# Patient Record
Sex: Female | Born: 1940 | Race: White | Hispanic: No | Smoking: Current every day smoker
Health system: Southern US, Community
[De-identification: ages and names within clinical notes are randomized; demographics above are authoritative.]

## PROBLEM LIST (undated history)

## (undated) DIAGNOSIS — Z954 Presence of other heart-valve replacement: Secondary | ICD-10-CM

## (undated) DIAGNOSIS — E785 Hyperlipidemia, unspecified: Secondary | ICD-10-CM

## (undated) DIAGNOSIS — M545 Low back pain, unspecified: Secondary | ICD-10-CM

## (undated) DIAGNOSIS — M810 Age-related osteoporosis without current pathological fracture: Secondary | ICD-10-CM

## (undated) DIAGNOSIS — Z9889 Other specified postprocedural states: Secondary | ICD-10-CM

## (undated) DIAGNOSIS — R51 Headache: Secondary | ICD-10-CM

## (undated) DIAGNOSIS — R519 Headache, unspecified: Secondary | ICD-10-CM

## (undated) DIAGNOSIS — G8929 Other chronic pain: Secondary | ICD-10-CM

## (undated) DIAGNOSIS — I679 Cerebrovascular disease, unspecified: Secondary | ICD-10-CM

## (undated) DIAGNOSIS — I099 Rheumatic heart disease, unspecified: Secondary | ICD-10-CM

## (undated) DIAGNOSIS — I951 Orthostatic hypotension: Principal | ICD-10-CM

## (undated) DIAGNOSIS — I10 Essential (primary) hypertension: Secondary | ICD-10-CM

## (undated) DIAGNOSIS — K219 Gastro-esophageal reflux disease without esophagitis: Secondary | ICD-10-CM

## (undated) DIAGNOSIS — F329 Major depressive disorder, single episode, unspecified: Secondary | ICD-10-CM

## (undated) DIAGNOSIS — Z87898 Personal history of other specified conditions: Secondary | ICD-10-CM

## (undated) DIAGNOSIS — F3289 Other specified depressive episodes: Secondary | ICD-10-CM

## (undated) DIAGNOSIS — G2581 Restless legs syndrome: Secondary | ICD-10-CM

## (undated) HISTORY — PX: TUBAL LIGATION: SHX77

## (undated) HISTORY — DX: Personal history of other specified conditions: Z87.898

## (undated) HISTORY — DX: Presence of other heart-valve replacement: Z95.4

## (undated) HISTORY — DX: Rheumatic heart disease, unspecified: I09.9

## (undated) HISTORY — DX: Low back pain, unspecified: M54.50

## (undated) HISTORY — PX: CATARACT EXTRACTION: SUR2

## (undated) HISTORY — DX: Other specified depressive episodes: F32.89

## (undated) HISTORY — DX: Hyperlipidemia, unspecified: E78.5

## (undated) HISTORY — DX: Cerebrovascular disease, unspecified: I67.9

## (undated) HISTORY — PX: APPENDECTOMY: SHX54

## (undated) HISTORY — DX: Headache, unspecified: R51.9

## (undated) HISTORY — DX: Major depressive disorder, single episode, unspecified: F32.9

## (undated) HISTORY — DX: Essential (primary) hypertension: I10

## (undated) HISTORY — PX: LUMBAR DISC SURGERY: SHX700

## (undated) HISTORY — DX: Restless legs syndrome: G25.81

## (undated) HISTORY — PX: DILATION AND CURETTAGE OF UTERUS: SHX78

## (undated) HISTORY — PX: AORTIC VALVE REPLACEMENT: SHX41

## (undated) HISTORY — DX: Low back pain: M54.5

## (undated) HISTORY — DX: Other specified postprocedural states: Z98.890

## (undated) HISTORY — DX: Other chronic pain: G89.29

## (undated) HISTORY — DX: Gastro-esophageal reflux disease without esophagitis: K21.9

## (undated) HISTORY — PX: OTHER SURGICAL HISTORY: SHX169

## (undated) HISTORY — PX: ABDOMINAL HYSTERECTOMY: SHX81

## (undated) HISTORY — DX: Orthostatic hypotension: I95.1

## (undated) HISTORY — DX: Age-related osteoporosis without current pathological fracture: M81.0

## (undated) HISTORY — DX: Headache: R51

## (undated) HISTORY — PX: TONSILLECTOMY: SUR1361

## (undated) HISTORY — PX: CHOLECYSTECTOMY: SHX55

---

## 1998-06-28 ENCOUNTER — Other Ambulatory Visit: Admission: RE | Admit: 1998-06-28 | Discharge: 1998-06-28 | Payer: Self-pay | Admitting: Internal Medicine

## 1999-02-20 ENCOUNTER — Ambulatory Visit (HOSPITAL_COMMUNITY): Admission: AD | Admit: 1999-02-20 | Discharge: 1999-02-20 | Payer: Self-pay | Admitting: Cardiology

## 1999-07-17 ENCOUNTER — Ambulatory Visit (HOSPITAL_COMMUNITY): Admission: RE | Admit: 1999-07-17 | Discharge: 1999-07-17 | Payer: Self-pay | Admitting: Neurology

## 1999-07-21 ENCOUNTER — Ambulatory Visit (HOSPITAL_COMMUNITY): Admission: RE | Admit: 1999-07-21 | Discharge: 1999-07-21 | Payer: Self-pay | Admitting: Neurology

## 1999-07-21 ENCOUNTER — Encounter: Payer: Self-pay | Admitting: Neurology

## 1999-08-22 ENCOUNTER — Encounter: Admission: RE | Admit: 1999-08-22 | Discharge: 1999-09-02 | Payer: Self-pay | Admitting: Anesthesiology

## 1999-12-16 ENCOUNTER — Ambulatory Visit (HOSPITAL_COMMUNITY): Admission: RE | Admit: 1999-12-16 | Discharge: 1999-12-16 | Payer: Self-pay | Admitting: Internal Medicine

## 1999-12-16 ENCOUNTER — Encounter: Payer: Self-pay | Admitting: Internal Medicine

## 2000-01-21 ENCOUNTER — Inpatient Hospital Stay (HOSPITAL_COMMUNITY): Admission: AD | Admit: 2000-01-21 | Discharge: 2000-01-27 | Payer: Self-pay | Admitting: Neurology

## 2000-01-24 ENCOUNTER — Encounter: Payer: Self-pay | Admitting: Neurology

## 2001-08-21 ENCOUNTER — Encounter: Payer: Self-pay | Admitting: Family Medicine

## 2001-08-21 ENCOUNTER — Encounter (INDEPENDENT_AMBULATORY_CARE_PROVIDER_SITE_OTHER): Payer: Self-pay | Admitting: *Deleted

## 2001-08-21 ENCOUNTER — Inpatient Hospital Stay (HOSPITAL_COMMUNITY): Admission: EM | Admit: 2001-08-21 | Discharge: 2001-08-29 | Payer: Self-pay | Admitting: Emergency Medicine

## 2001-10-26 ENCOUNTER — Ambulatory Visit (HOSPITAL_COMMUNITY): Admission: RE | Admit: 2001-10-26 | Discharge: 2001-10-26 | Payer: Self-pay | Admitting: *Deleted

## 2001-10-26 ENCOUNTER — Encounter (INDEPENDENT_AMBULATORY_CARE_PROVIDER_SITE_OTHER): Payer: Self-pay | Admitting: Specialist

## 2002-03-08 ENCOUNTER — Emergency Department (HOSPITAL_COMMUNITY): Admission: EM | Admit: 2002-03-08 | Discharge: 2002-03-08 | Payer: Self-pay | Admitting: Emergency Medicine

## 2002-04-06 ENCOUNTER — Inpatient Hospital Stay (HOSPITAL_COMMUNITY): Admission: EM | Admit: 2002-04-06 | Discharge: 2002-04-08 | Payer: Self-pay | Admitting: Emergency Medicine

## 2002-04-07 ENCOUNTER — Encounter: Payer: Self-pay | Admitting: Emergency Medicine

## 2002-04-17 ENCOUNTER — Inpatient Hospital Stay (HOSPITAL_COMMUNITY): Admission: AD | Admit: 2002-04-17 | Discharge: 2002-04-23 | Payer: Self-pay | Admitting: Neurology

## 2002-04-18 ENCOUNTER — Encounter: Payer: Self-pay | Admitting: Neurology

## 2002-06-09 ENCOUNTER — Inpatient Hospital Stay (HOSPITAL_COMMUNITY): Admission: EM | Admit: 2002-06-09 | Discharge: 2002-06-17 | Payer: Self-pay | Admitting: Emergency Medicine

## 2002-06-09 ENCOUNTER — Encounter: Payer: Self-pay | Admitting: Emergency Medicine

## 2002-06-09 ENCOUNTER — Encounter: Payer: Self-pay | Admitting: Neurology

## 2002-06-12 ENCOUNTER — Encounter (INDEPENDENT_AMBULATORY_CARE_PROVIDER_SITE_OTHER): Payer: Self-pay | Admitting: Cardiology

## 2002-06-12 ENCOUNTER — Encounter: Payer: Self-pay | Admitting: Neurology

## 2002-06-14 ENCOUNTER — Encounter: Payer: Self-pay | Admitting: *Deleted

## 2004-02-19 ENCOUNTER — Encounter: Admission: RE | Admit: 2004-02-19 | Discharge: 2004-02-19 | Payer: Self-pay | Admitting: Neurology

## 2005-01-28 ENCOUNTER — Ambulatory Visit (HOSPITAL_COMMUNITY): Admission: RE | Admit: 2005-01-28 | Discharge: 2005-01-28 | Payer: Self-pay | Admitting: Cardiology

## 2005-01-28 ENCOUNTER — Encounter: Payer: Self-pay | Admitting: Cardiology

## 2005-02-04 ENCOUNTER — Encounter: Admission: RE | Admit: 2005-02-04 | Discharge: 2005-02-04 | Payer: Self-pay | Admitting: Neurology

## 2006-05-27 ENCOUNTER — Encounter: Admission: RE | Admit: 2006-05-27 | Discharge: 2006-08-25 | Payer: Self-pay | Admitting: Neurology

## 2008-07-25 ENCOUNTER — Emergency Department (HOSPITAL_COMMUNITY): Admission: EM | Admit: 2008-07-25 | Discharge: 2008-07-25 | Payer: Self-pay | Admitting: Emergency Medicine

## 2008-10-22 ENCOUNTER — Encounter: Payer: Self-pay | Admitting: Cardiology

## 2008-10-22 ENCOUNTER — Encounter (HOSPITAL_COMMUNITY): Admission: RE | Admit: 2008-10-22 | Discharge: 2009-01-20 | Payer: Self-pay | Admitting: Cardiology

## 2008-10-30 ENCOUNTER — Encounter: Payer: Self-pay | Admitting: Cardiology

## 2010-03-28 ENCOUNTER — Encounter: Payer: Self-pay | Admitting: Cardiology

## 2010-04-16 ENCOUNTER — Encounter: Payer: Self-pay | Admitting: Cardiology

## 2010-05-18 ENCOUNTER — Emergency Department (HOSPITAL_COMMUNITY): Admission: EM | Admit: 2010-05-18 | Discharge: 2010-05-18 | Payer: Self-pay | Admitting: Emergency Medicine

## 2010-06-19 DIAGNOSIS — I099 Rheumatic heart disease, unspecified: Secondary | ICD-10-CM | POA: Insufficient documentation

## 2010-06-19 DIAGNOSIS — F329 Major depressive disorder, single episode, unspecified: Secondary | ICD-10-CM

## 2010-06-19 DIAGNOSIS — I10 Essential (primary) hypertension: Secondary | ICD-10-CM | POA: Insufficient documentation

## 2010-06-20 ENCOUNTER — Ambulatory Visit: Payer: Self-pay | Admitting: Cardiology

## 2010-06-20 DIAGNOSIS — F17200 Nicotine dependence, unspecified, uncomplicated: Secondary | ICD-10-CM

## 2010-06-20 DIAGNOSIS — Z954 Presence of other heart-valve replacement: Secondary | ICD-10-CM

## 2010-06-20 DIAGNOSIS — Z9889 Other specified postprocedural states: Secondary | ICD-10-CM

## 2010-06-20 DIAGNOSIS — R072 Precordial pain: Secondary | ICD-10-CM | POA: Insufficient documentation

## 2010-07-08 ENCOUNTER — Telehealth (INDEPENDENT_AMBULATORY_CARE_PROVIDER_SITE_OTHER): Payer: Self-pay | Admitting: *Deleted

## 2010-07-08 ENCOUNTER — Encounter: Payer: Self-pay | Admitting: *Deleted

## 2010-07-09 ENCOUNTER — Encounter (HOSPITAL_COMMUNITY)
Admission: RE | Admit: 2010-07-09 | Discharge: 2010-09-13 | Payer: Self-pay | Source: Home / Self Care | Attending: Cardiology | Admitting: Cardiology

## 2010-07-09 ENCOUNTER — Encounter: Payer: Self-pay | Admitting: Cardiovascular Disease

## 2010-07-09 ENCOUNTER — Ambulatory Visit: Payer: Self-pay

## 2010-07-09 ENCOUNTER — Ambulatory Visit: Payer: Self-pay | Admitting: Cardiovascular Disease

## 2010-07-09 ENCOUNTER — Encounter: Payer: Self-pay | Admitting: Cardiology

## 2010-07-09 ENCOUNTER — Ambulatory Visit (HOSPITAL_COMMUNITY): Admission: RE | Admit: 2010-07-09 | Discharge: 2010-07-09 | Payer: Self-pay | Admitting: Cardiology

## 2010-07-14 ENCOUNTER — Telehealth: Payer: Self-pay | Admitting: Cardiology

## 2010-07-17 ENCOUNTER — Telehealth (INDEPENDENT_AMBULATORY_CARE_PROVIDER_SITE_OTHER): Payer: Self-pay | Admitting: *Deleted

## 2010-07-24 ENCOUNTER — Encounter: Payer: Self-pay | Admitting: Cardiology

## 2010-10-14 NOTE — Progress Notes (Signed)
  Walk in Patient Form Recieved " Pt needs surgical [place] paper left from [facility] sent to Message Nurse. [facility] [place]  July 17, 2010 1:37 PM

## 2010-10-14 NOTE — Progress Notes (Signed)
Summary: Nuclear pre procedure  Phone Note Outgoing Call Call back at [facility] Phone [phone]   Call placed by: Rea [employer], CMA,  July 08, 2010 3:42 PM Call placed to: Patient Summary of Call: Left message with information on Myoview Information Sheet (see scanned document for details).      Nuclear Med Background Indications for Stress Test: Evaluation for Ischemia, Abnormal EKG   History: Echo, Myocardial Perfusion Study  History Comments: S/P AVR/MVR; '10 [id number]  Symptoms: Chest Pain, Chest Pressure, DOE    Nuclear Pre-Procedure Cardiac Risk Factors: CVA, Family History - CAD, Hypertension, IDDM Type 2, Lipids Height (in): 70

## 2010-10-14 NOTE — Assessment & Plan Note (Signed)
Summary: cardiac eval/mt   History of Present Illness: 70 year old female for evaluation of aortic and mitral valvular disease. Patient status post aortic valve replacement and mitral valve replacement in 1990. Last echocardiogram in February of 2010 showed normal LV function, prosthetic aortic and mitral valves. Myoview in February of and showed an ejection fraction of 70% and no ischemia. Patient has mild dyspnea with exertion relieved with rest. There is no orthopnea, PND, pedal edema, palpitations or syncope. She has had intermittent chest pain since 1986 when she had strokes. It is in the left upper chest area. There is a sharp pain as well as a pressure. The pain can increase with certain movements. It is not exertional. She does take nitroglycerin for her pains.  Current Medications (verified): 1)  Coumadin 5 Mg Tabs (Warfarin Sodium) .... As Directed 2)  Nitrostat 0.4 Mg Subl (Nitroglycerin) .Marland Kitchen.. 1 Tablet Under Tongue At Onset of Chest Pain; You May Repeat Every 5 Minutes For Up To 3 Doses. 3)  Gabapentin 800 Mg Tabs (Gabapentin) .Marland Kitchen.. 1 Tab  Every 4 Hrs 4)  Amitriptyline Hcl 25 Mg Tabs (Amitriptyline Hcl) .... 3 Tabs By Mouth At Bedtime 5)  Metoprolol Succinate 50 Mg Xr24h-Tab (Metoprolol Succinate) .... 1/2 Tab By Mouth Two Times A Day  Past History:  Past Medical History: RHEUMATIC HEART DISEASE COUMADIN THERAPY DEPRESSION HYPERTENSION Mild hyperlipidemia GERD Remote H/O PUD History of mechanical valve, aortic and mitral, on chronic Coumadin therapy. 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  Past Surgical History: aortic valve replacement Mitral valve replacement 1990 hysterectomy Lumbar  Surgery   Arthroscopic  Knee  Surgery  History of cholecystectomy. History of D&C. Bilateral tubal ligation. Tonsillectomy. Appendectomy. Right knee surgery in 1993.  Family History: Reviewed history from 06/19/2010 and no  changes required.  Reveals that her mother died in her 81s from uterine cancer. Her father died at 40 from stomach cancer. She had one brother who  died at 57 from myocardial infarction. She had one brother who died at 55 from cancer. She has sisters 65 and 36 living and well. She has had four sisters who died, one from aneurysm and one from cancer, one from diabetes. She has  four children. One daughter died at age 81 from heart disease and had a heart murmur. One son died at age 55 from leukemia. She has a daughter 74, living and well, and a son 69 living and well.  Social History: Reviewed history from 06/19/2010 and no changes required.  She smokes 3-4 cigarettes a day. She does not drink  alcohol. Widowed   Review of Systems       Problems with dysarthria and right sided weakness from previous CVA but no fevers or chills, productive cough, hemoptysis, dysphasia, odynophagia, melena, hematochezia, dysuria, hematuria, rash, seizure activity, orthopnea, PND, pedal edema, claudication. Remaining systems are negative.   Vital Signs:  Patient profile:   70 year old female Weight:      67 pounds Pulse rate:   86 / minute BP sitting:   149 / 76  (left arm)  Vitals Entered By: Kem Parkinson (June 20, 2010 3:32 PM)  Physical Exam  General:  Well developed/well nourished in NAD Skin warm/dry Patient not depressed No peripheral clubbing Back-normal HEENT-normal/normal eyelids Neck supple/normal carotid upstroke bilaterally; no bruits; no JVD; no thyromegaly chest - CTA/ normal expansion CV - RRR/crisp mechanical valve sounds. 2/6 systolic ejection murmur. No diastolic murmur. Abdomen -NT/ND,  no HSM, no mass, + bowel sounds, no bruit 2+ femoral pulses, no bruits Ext-no edema, chords, 2+ DP Neuro-dysarthria. Mild weakness right upper and right lower extremity.     EKG  Procedure date:  06/20/2010  Findings:      Unusual P-wave axis. Probable ectopic atrial rhythm.  Incomplete right bundle branch block. No ST changes.  Impression & Recommendations:  Problem # 1:  CHEST PAIN, PRECORDIAL (ICD-786.51) Symptoms somewhat atypical and chronic. Schedule Myoview. Her updated medication list for this problem includes:    Coumadin 5 Mg Tabs (Warfarin sodium) .Marland Kitchen... As directed    Nitrostat 0.4 Mg Subl (Nitroglycerin) .Marland Kitchen... 1 tablet under tongue at onset of chest pain; you may repeat every 5 minutes for up to 3 doses.    Metoprolol Succinate 50 Mg Xr24h-tab (Metoprolol succinate) .Marland Kitchen... 1/2 tab by mouth two times a day    Aspirin 81 Mg Tbec (Aspirin) .Marland Kitchen... Take one tablet by mouth daily  Orders: Nuclear Stress Test (Nuc Stress Test)  Problem # 2:  COUMADIN THERAPY (ICD-V58.61) Monitored by primary care. Goal INR 2.5-3.5. Orders: Echocardiogram (Echo)  Problem # 3:  MITRAL VALVE REPLACEMENT, HX OF (ICD-V15.1) Continued SBE prophylaxis. Scheduled echocardiogram. Add enteric-coated aspirin 81 mg p.o. daily.  Problem # 4:  AORTIC VALVE REPLACEMENT, HX OF (ICD-V43.3) As per #3.  Problem # 5:  TOBACCO ABUSE (ICD-305.1) Patient counseled on discontinuing.  Problem # 6:  HYPERTENSION (ICD-401.9) Blood pressure mildly elevated but she states is normal at home. We will follow this and add additional medications as needed. Her updated medication list for this problem includes:    Metoprolol Succinate 50 Mg Xr24h-tab (Metoprolol succinate) .Marland Kitchen... 1/2 tab by mouth two times a day    Aspirin 81 Mg Tbec (Aspirin) .Marland Kitchen... Take one tablet by mouth daily  Patient Instructions: 1)  Your physician recommends that you schedule a follow-up appointment in: ONE YEAR 2)  Your physician has recommended you make the following change in your medication: START ASPIRIN 81MG  ONCE DAILY 3)  Your physician has requested that you have an echocardiogram.  Echocardiography is a painless test that uses sound waves to create images of your heart. It provides your doctor with information about  the size and shape of your heart and how well your heart's chambers and valves are working.  This procedure takes approximately one hour. There are no restrictions for this procedure. 4)  Your physician has requested that you have an exercise stress myoview.  For further information please visit [email or web address]  Please follow instruction sheet, as given.

## 2010-10-14 NOTE — Progress Notes (Signed)
Summary: discuss anibiotic for eye surgery - discuss coumadin  Phone Note Call from Patient Call back at Home Phone [phone]   Caller: Vanessa Bartlett [phone] Reason for Call: Talk to Nurse Summary of Call: per pt son called, does pt need any antibiotic for eye surgery , does she need to come off coumadin.  Initial call taken by: [name],  July 14, 2010 3:56 PM  Follow-up for Phone Call        spoke with pt son, pt will require antibiotics prior tp procedure. they have a pre-op appt this week and will find out if coumadin is an issue and call back [name], RN  July 14, 2010 5:18 PM     New/Updated Medications: AMOXICILLIN 500 MG CAPS (AMOXICILLIN) take one hour prior to procedure Prescriptions: AMOXICILLIN 500 MG CAPS (AMOXICILLIN) take one hour prior to procedure  #4 x 6   Entered by:   [name], RN   Authorized by:   [name], MD, [name]   Signed by:   [name], RN on 07/14/2010   Method used:   Electronically to        [facility]  [phone]* (retail)       [address] [place], [place]  [address]       Ph: [phone] or [phone]       Fax: [phone]   RxID:   [id number]

## 2010-10-14 NOTE — Assessment & Plan Note (Signed)
Summary: Cardiology Nuclear Testing  Nuclear Med Background Indications for Stress Test: Evaluation for Ischemia, Abnormal EKG   History: Echo, Myocardial Perfusion Study  History Comments: '90 AVR/MVR; '10 QIO:NGEXBM  Symptoms: Chest Pain, Chest Pressure, Diaphoresis, Dizziness, DOE, Fatigue, Light-Headedness, Rapid HR  Symptoms Comments: Last episode of WU:XLKG week   Nuclear Pre-Procedure Cardiac Risk Factors: CVA, Family History - CAD, Hypertension, Lipids, Smoker Caffeine/Decaff Intake: None NPO After: 9:00 PM Lungs: Clear.  O2 Sat 98% on RA. IV 0.9% NS with Angio Cath: 22g     IV Site: R Antecubital IV Started by: Bonnita Levan, RN Chest Size (in) 34     Cup Size B     Height (in): 70 Weight (lb): 137 BMI: 19.73  Nuclear Med Study 1 or 2 day study:  1 day     Stress Test Type:  Eugenie Birks Reading MD:  Charlton Haws, MD     Referring MD:  Olga Millers, MD Resting Radionuclide:  Technetium 67m Tetrofosmin     Resting Radionuclide Dose:  11 mCi  Stress Radionuclide:  Technetium 25m Tetrofosmin     Stress Radionuclide Dose:  33 mCi   Stress Protocol   Lexiscan: 0.4 mg   Stress Test Technologist:  Rea [employer], CMA-N     Nuclear Technologist:  Doyne Keel, CNMT  Rest Procedure  Myocardial perfusion imaging was performed at rest 45 minutes following the intravenous administration of Technetium 63m Tetrofosmin.  Stress Procedure  The patient received IV Lexiscan 0.4 mg over 15-seconds.  Technetium 50m Tetrofosmin injected at 30-seconds.  There were no significant changes with infusion, other than occasional PVC's.  Quantitative spect images were obtained after a 45 minute delay.  QPS Raw Data Images:  Normal; no motion artifact; normal heart/lung ratio. Stress Images:  Normal homogeneous uptake in all areas of the myocardium. Rest Images:  Normal homogeneous uptake in all areas of the myocardium. Subtraction (SDS):  Normal Transient Ischemic Dilatation:  .94  (Normal  <1.22)  Lung/Heart Ratio:  .29  (Normal <0.45)  Quantitative Gated Spect Images QGS EDV:  46 ml QGS ESV:  14 ml QGS EF:  69 % QGS cine images:  normal  Findings Normal nuclear study      Overall Impression  Exercise Capacity: Lexiscan with no exercise. BP Response: Normal blood pressure response. Clinical Symptoms: Nausea, Dyspnea and Headache ECG Impression: No significant ST segment change suggestive of ischemia. Overall Impression: Normal stress nuclear study.  Appended Document: Cardiology Nuclear Testing ok  Appended Document: Cardiology Nuclear Testing pt aware of results

## 2010-10-14 NOTE — Letter (Signed)
Summary: [facility] Surgical Clearance   [facility] Surgical Clearance   Imported By: Roderic Ovens 07/31/2010 17:29:25  _____________________________________________________________________  External Attachment:    Type:   Image     Comment:   External Document

## 2010-11-27 LAB — DIFFERENTIAL
Basophils Absolute: 0 10*3/uL (ref 0.0–0.1)
Lymphocytes Relative: 20 % (ref 12–46)
Lymphs Abs: 0.8 10*3/uL (ref 0.7–4.0)
Neutro Abs: 2.8 10*3/uL (ref 1.7–7.7)

## 2010-11-27 LAB — BASIC METABOLIC PANEL
BUN: 13 mg/dL (ref 6–23)
CO2: 24 mEq/L (ref 19–32)
Chloride: 108 mEq/L (ref 96–112)
GFR calc Af Amer: 33 mL/min — ABNORMAL LOW (ref >60)
GFR calc non Af Amer: 28 mL/min — ABNORMAL LOW (ref >60)
Sodium: 137 mEq/L (ref 135–145)

## 2010-11-27 LAB — CBC
HCT: 37.6 % (ref 36.0–46.0)
MCHC: 34.4 g/dL (ref 30.0–36.0)
Platelets: 134 10*3/uL — ABNORMAL LOW (ref 150–400)
RDW: 14.6 % (ref 11.5–15.5)

## 2010-12-30 ENCOUNTER — Other Ambulatory Visit: Payer: Self-pay | Admitting: Neurology

## 2010-12-30 DIAGNOSIS — R42 Dizziness and giddiness: Secondary | ICD-10-CM

## 2010-12-30 DIAGNOSIS — I679 Cerebrovascular disease, unspecified: Secondary | ICD-10-CM

## 2010-12-30 DIAGNOSIS — R4701 Aphasia: Secondary | ICD-10-CM

## 2010-12-30 DIAGNOSIS — R209 Unspecified disturbances of skin sensation: Secondary | ICD-10-CM

## 2010-12-30 DIAGNOSIS — R51 Headache: Secondary | ICD-10-CM

## 2011-01-08 ENCOUNTER — Ambulatory Visit
Admission: RE | Admit: 2011-01-08 | Discharge: 2011-01-08 | Disposition: A | Discharge status: Home / Self Care | Payer: Medicare Other | Source: Ambulatory Visit | Attending: Neurology | Admitting: Neurology

## 2011-01-08 DIAGNOSIS — I679 Cerebrovascular disease, unspecified: Secondary | ICD-10-CM

## 2011-01-08 DIAGNOSIS — R42 Dizziness and giddiness: Secondary | ICD-10-CM

## 2011-01-08 DIAGNOSIS — R51 Headache: Secondary | ICD-10-CM

## 2011-01-08 DIAGNOSIS — R4701 Aphasia: Secondary | ICD-10-CM

## 2011-01-08 DIAGNOSIS — R209 Unspecified disturbances of skin sensation: Secondary | ICD-10-CM

## 2011-01-30 NOTE — H&P (Signed)
[place]  Patient:    Vanessa Bartlett, Vanessa Bartlett Visit Number: [id number] MRN: [id number]          Service Type: MED Location: [id number] Attending Physician:  Thedore Mins Dictated by:   Trudee Kuster, M.D. Admit Date:  08/21/2001   CC:         Soyla Murphy. Renne Crigler, M.D.   History and Physical  ADMITTING DIAGNOSES: 1. Vaginal bleed. 2. Coagulopathy of idiopathic origin. 3. Anorexia and diarrhea.  HISTORY OF PRESENT ILLNESS:  Vanessa Bartlett presented to the emergency department after complaining of being sick for the past four weeks.  She states that over the last three weeks she has not been able to eat.  She has been having diarrhea over that time.  Shortly after eating she has to evacuate and feels like that food is going straight through her.  The patient was unable to see her primary physician secondary to inclement weather.  Over the last three to four days, the patient has developed severe vaginal bleeding.  She is on Coumadin secondary to a history of valve replacement and CVAs.  The patient denies any other bleeds.  The patient denies any melena or hematochezia.  The patient denies fevers or chills.  The patient has had some nausea but no vomiting.  The patient has had some intermittent chest pain which is chronic as she is on nitroglycerin.  She has a history of coronary artery disease it seems.  PAST MEDICAL HISTORY:  The past medical history is significant for CVA x 2, cholecystectomy, multiple back surgeries, and hysterectomy secondary to dysfunctional uterine bleeding and atrial fibrillation.  MEDICATIONS: 1. Coumadin 5 mg p.o. q.d. 2. Zyprexa 5 mg q.h.s. 3. Zoloft 50 mg q.d. 4. Temazepam 15 mg p.o. q.h.s. 5. Lopressor 25 mg b.i.d. 6. Xanax 0.5 mg t.i.d. p.r.n. 7. Nitroglycerin patch. 8. Prevacid 30 mg p.o. q.d. 9. Neurontin 600 mg p.o. q.i.d.  FAMILY HISTORY:  No history of gynecologic cancers. Mother died of  esophageal cancer.  ALLERGIES:  No known drug allergies.  SOCIAL HISTORY:  The patient lives alone in [place], [place].  He does smoke.  REVIEW OF SYSTEMS:  As above.  The patient denies any chest pain currently . The patient has no dyspnea or shortness of breath.  The patient denies any abdominal pain, urinary systems.  PHYSICAL EXAMINATION:  VITAL SIGNS:  Temperature 97.4, pulse 74, blood pressure 122/90, O2 saturations 95% on room air.  GENERAL:  Elderly woman in no obvious distress.  HEENT:  Normocephalic.  Pupils are equal, round and reactive to light.  No papilledema.  TMs clear.  Oropharynx pink, moist mucosa.  NECK:  Supple, no bruits, no adenopathy.  HEART:  Irregular rhythm, mechanical click, 2/6 systolic murmur.  LUNGS:  Clear bilaterally.  ABDOMEN:  Soft. Positive bowel sounds.  EXTREMITIES:  No cyanosis, clubbing or edema.  NEUROLOGICAL:  The patient is alert and oriented.  Cranial nerves II-XII grossly intact.  Strength is 5/5 in all extremities.  LABORATORY DATA:  Hemoglobin 16.0, white cell count 5.7, platelet 207,000. INR 22.2.  Urinalysis negative.  ASSESSMENT AND PLAN: 1. Vaginal bleeding.  The patient comes in with primary complaint of    vaginal bleeding.  The patient has an uncertain etiology as she has    had a hysterectomy.  Given the fact that she has had weight loss,    and bleeding, I would be concerned for some time of gynecologic  malignancy.  The patient probably wants evaluation by gynecology. 2. Coagulopathy.  The patient is on Coumadin, and typically has this    checked in the Coumadin Clinic at [facility].  However,    the patients INR is still elevated today.  Because she is having    some evidence of vaginal bleeding we will give 5 mg of vitamin K    intravenously.  The patient is hemodynamically stable. Hold all    Coumadin at this point in time and proceed with additional monitoring. 3. Anorexia.  The patient has  not been feeling well for the last several    weeks.  Based on the laboratory data that I have at this time, there is    no clear cut etiology.  My concern would be an occult malignancy in    this patient.  She states that she has lost 12-16 pounds over the    last several weeks but she has not been eating anything according to    her son.  This is probably a combination of dehydration, poor intake. 4. Diarrhea.  The patient reports frequent loose stools shortly after    eating.  We will continue to follow this, and the patient will need    work-up.  I do not see any evidence of an infectious cause at this time.    The patient is afebrile and does not have a white count.  Her    abdominal exam was benign. Dictated by:   Trudee Kuster, M.D. Attending Physician:  Thedore Mins DD:  08/21/01 TD:  08/21/01 Job: [id number]

## 2011-01-30 NOTE — H&P (Signed)
NAME:  Vanessa Bartlett, Vanessa Bartlett                            ACCOUNT NO.:  [id number]   MEDICAL RECORD NO.:  [id number]                   PATIENT TYPE:  INP   LOCATION:  3004                                 FACILITY:  [facility]   PHYSICIAN:  Genene Churn. Love, MD                   DATE OF BIRTH:  [birth year]   DATE OF ADMISSION:  06/09/2002  DATE OF DISCHARGE:                                HISTORY & PHYSICAL   CHIEF COMPLAINT:  This is  one of several [facility] admissions for  this 70 year old  right-handed white widowed female who lives alone. She is  brought to the emergency room and admitted after a fall with complaints of  right-sided weakness.   HISTORY OF PRESENT ILLNESS:  The patient has a history of left brain stroke  with aphasia in the1980s, possibly 1985, and has had a repeat stroke in  November of 2001. At that time she required a PEG tube for difficulty with  dysphagia. She has had residual right-sided weakness and stuttering speech  but lives alone and is independent in activities of daily living. On the  night of admission about 6:30 p.m., she was going up steps and fell when her  right leg gave way. She went down the entire set of  steps,  striking her  occiput and her right knee. She came to the emergency room with complaints  of new onset right-sided weakness.   PAST MEDICAL HISTORY:  Significant for:  1. 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  2. Status post aortic valve and mitral valve replacement with mechanical     valves for rheumatic heart disease.  3. She has a history of rheumatic heart disease.  4. Congenital atrophy of one kidney.  5. Status post stroke with aphasia and right hemiparesis.  6. Hypertension.  7. Chronic cigarette use.   PAST SURGICAL HISTORY:  She has had operations including:  1. Cholecystectomy in 1993.  2. Hysterectomy.  3. Dilatation and curettages x 4.  4. Bilateral tubal ligations.  5.  Tonsillectomy.  6. Appendectomy.  7. Right knee surgery in  1993.   MEDICATIONS:  1. Neurontin 600 mg q.i.d.  2. Darvocet-N 100 1 q.i.d.  3. Amitriptyline 25, 2 q.h.s.  4. Metoprolol 50 mg 1/2 b.i.d.  5. Zoloft 100 mg q.d.  6. Warfarin 5 mg q.d.  7. OxyContin 10 mg t.i.d.  8. Prevacid 30 mg q.d.  9. Nitroglycerin.   ALLERGIES:  No known history of allergies.   SOCIAL HISTORY:  She smokes three to four cigarettes per day. She does not  drink alcohol.  Education, she went through high school. She is currently on  disability.   FAMILY HISTORY:  Reveals that her mother died in her 64s from uterine  cancer. Her father died at 104 from stomach  cancer. She had one brother who  died at 43 from myocardial infarction. She had one brother who died at 28  from cancer. She has sisters 39 and 19 living and well. She has had four  sisters who died, one from MI and one from cancer, one from diabetes. She  has  four children. One daughter died at age 45 from heart disease and had a  heart murmur. One son died at age 9 from leukemia. She has a daughter 33,  living and well, and a son 51 living and well.   PHYSICAL EXAMINATION:  GENERAL:  A well developed white female lying in bed  with an abrasion to her occiput and a right knee abrasion. She was in a neck  collar.  VITAL SIGNS:  Blood pressure right arm 140/80, left arm 130/80, heart rate  77 and irregular. There were no carotid or supraclavicular bruits heard.  HEENT:  Revealed the tympanic membranes to be clear. She had abrasions as  mentioned.  LUNGS:  Clear to auscultation.  HEART:  No murmurs. She did have mechanical heart sounds.  ABDOMEN:  There was no enlargement of  liver spleen or kidneys.  Bowel  sounds were normal.  EXTREMITIES:  No cyanosis, clubbing or edema.  NEUROLOGIC:  Mental status, she is alert and oriented x 3. She knew the  present governor but not the vice president. She could follow one, two and  three step commands.  She spoke with stuttering speech. She could name  objects and could repeat phrases. Her cranial nerve examination revealed the  visual fields  to be full without any definite visual field loss. She had a  mild right seventh, she had stuttering speech. Her tongue was benign. Gags  were present. Motor examination revealed unusual ataxia in the upper arm and  in the right lower leg, which was hard to evaluate. There was also giving  away phenomena in the right leg. Her sensory examination revealed subjective  altered sensation to pinprick in the right face, arm and leg. When one point  was given she would feel none; when two points were given, she would feel  one in her right hand. The deep tendon reflexes were 1 to 2+. Plantar  responses were  downgoing.   LABORATORY DATA:  An EKG showed incomplete right bundle branch block. A CT  scan showed an old left middle cerebral artery and old right middle cerebral  artery strokes. A CT scan of the  cervical spine was negative. There was  some evidence of spondylosis of the C5-6.   White blood cell count 9100, hemoglobin 14.3, hematocrit 42.3, platelets 155  K.  Protime 18.2 with an INR of 1.6. Sodium 135, potassium 4.0, chloride  104, CO2 content 24, BUN 27, creatinine 1.8, glucose 104, calcium 9.2.   IMPRESSION:  1. Fall with head trauma, code 850.0.  2. Right-sided weakness, code 342.10.  3. Rule out stroke, code 434.01.  4. Aphasia,  code 784.3.  5. Stroke, left brain and right brain in the past, code 434.01.  6 .  History of rheumatic fever.  1. Aortic valve, 424.1, replacement and mitral valve 424.0 replacement.  2. Lumbar radiculopathy, right S1, code 353.4.  3. Low back pain, code 724.4.  4. Hypertension code 796.2.    PLAN:  The plan at this time is to admit the patient and place her on  heparin therapy, and then switch to  Coumadin.  Genene Churn. Love, MD   JML/MEDQ  D:  06/09/2002   T:  06/12/2002  Job:  [id number]   cc:   Soyla Murphy. Renne Crigler, M.D.  [address]  [place]  [place] [address]  Fax: [phone]

## 2011-01-30 NOTE — Discharge Summary (Signed)
[place]. [facility]  Patient:    Vanessa Bartlett, Vanessa Bartlett                         MRN: [id number] Adm. Date:  [id number] Disc. Date: 01/27/00 Attending:  Lesly Dukes CC:         [facility]                           Discharge Summary  ADMISSION DIAGNOSES:  1. Neck pain, probable left cervical radiculopathy.  2. 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  3. 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  4. History of hypertension.  DISCHARGE DIAGNOSES:  1. History of left neck and left arm pain, etiology unclear.  2. History of hypertension.  3. History of prior left brain stroke with residual aphasia.  PROCEDURES:  1. Cervical myelogram.  2. CT scan of the cervical spine.  COMPLICATIONS: None.  HISTORY OF PRESENT ILLNESS: Vanessa Bartlett is a 70 year old right-handed white female born 22-Jul-1941, with a history of chronic low back pain and right leg pain.  The patient has had problems with neck pain and left arm pain that had begun ten days prior to hospitalization.  The patient claims the left arm feels weaker than usual.  The patient was given a trial on Neurontin and is on Vioxx with no significant improvement with the pain.  The patient is brought into the hospital for cervical myelogram.  She was taken off Coumadin prior to this.  PAST MEDICAL HISTORY:  1. History of lumbosacral spine surgery x 3 in the past, last in 1987, with     chronic right S1 radiculopathy.  2. Aortic valve and mitral valve replacement with mechanical valve.  3. History of rheumatic heart disease.  4. History of gallbladder resection.  5. History of hysterectomy in 1977.  6. History of D&C x 4 in the past.  7. History of bilateral tubal ligation.  8. History of tonsillectomy.  9. History of appendectomy. 10. History of right knee surgery. 11. History of congenital  atrophy of one kidney. 12. 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 13. History of hypertension.  MEDICATIONS PRIOR TO ADMISSION:  1. Neurontin 600 mg t.i.d.  2. Amitriptyline 100 mg q.h.s.  3. Lanoxin 0.125 mg q.d.  4. Norvasc 5 mg q.d.  5. Premarin 0.625 mg q.d.  6. Remeron 15 mg q.d.  ALLERGIES: No known drug allergies.  SOCIAL HISTORY: The patient smokes one-third a pack of cigarettes a day and does not drink alcohol.  FAMILY HISTORY, SOCIAL HISTORY, REVIEW OF SYSTEMS, PHYSICAL EXAMINATION: Please refer to the History and Physical dictation for details.  LABORATORY DATA: CBC showed a WBC of 8.1, hemoglobin 12.9, hematocrit 39.1, MCV 94.3, platelets 214,000.  Sedimentation rate 28.  Sodium 141, potassium 4.2, chloride 103, CO2 31, glucose 99, BUN 14, creatinine 1.2.  Calcium 9.4, total protein 7.3, albumin 3.7, AST 25, ALT 13, alkaline phosphatase 137, total bilirubin 0.7.  Digoxin level 0.9.  EKG revealed heart rate of 86, possible ectopic atrial rhythm; ST-T wave abnormalities.  HOSPITAL COURSE: The patient has done fairly well during the course of hospitalization.  The patient was admitted to be placed on heparin prior to cervical myelogram.  The patient was taken off Coumadin  two days prior to admission.  INR on admission was 1.7.  The patient was set up for CT following cervical myelogram after amitriptyline was discontinued.  The patient continued to have pain in the left neck and arm and some weakness in intrinsic muscles of the left hand.  The patient was set up for the study, which was done on Jan 24, 2000.  The study revealed evidence of facet disease at C3-4, C4-5, and bilaterally at C5-6 levels.  There was a nerve root cut-off at C6 level on the right, possibly a little bit on the left, but the neural foramen appeared to be fairly open on CT scan on the left.  The patient was placed back on Coumadin following the procedure and was  discharged on her usual Coumadin dosing, which was 5 mg q.d. with the exception of 1/2 tablet on Tuesdays and Thursdays.  DISCHARGE MEDICATIONS:  1. Sublingual nitroglycerin 0.4 mg p.r.n.  2. Lanoxin 0.125 mg q.d.  3. Coumadin as above.  4. Norvasc 5 mg q.d.  5. Prevacid 30 mg q.d.  6. Lipitor 10 mg q.d.  7. Premarin 0.625 mg q.d.  8. Amitriptyline 100 mg q.h.s.  9. Neurontin 600 mg t.i.d. 10. Soma 1 t.i.d. if needed.  FOLLOW-UP: We will follow up with this patient in about three weeks following discharge, and may pursue an EMG nerve conduction study if pain continues.  DISCHARGE CONDITION: At the time of discharge the patient was bright, alert, cooperative, fully ambulatory, and had residual aphasia.  DD:  01/27/00 TD:  01/27/00 Job: [id number]

## 2011-01-30 NOTE — Discharge Summary (Signed)
NAME:  Bartlett, Vanessa                            ACCOUNT NO.:  [id number]   MEDICAL RECORD NO.:  [id number]                   PATIENT TYPE:  INP   LOCATION:  3741                                 FACILITY:  [facility]   PHYSICIAN:  Soyla Murphy. Renne Crigler, M.D.               DATE OF BIRTH:  06/27/1941   DATE OF ADMISSION:  04/06/2002  DATE OF DISCHARGE:  04/08/2002                                 DISCHARGE SUMMARY   ELECTROCARDIOGRAM:  Normal sinus rhythm, within normal limits.   LABORATORY REPORTS:  Initial pH 7.53, PCO2 29, PO2 91.  Amylase 63.  CK and  CK-MB were normal.  Initial sodium 138, potassium 3.4.  The remainder of the  CMET was normal.  Initial INR was high at 5.2.  Lipase was 46.  Troponin-I  was less than 0.01.  BNP 51.9 - normal.  Partial thromboplastin time 50.  CBC normal.  Differential normal.  On the 25th, INR was 4.7 and CK-MB's were  negative.  Repeat potassium was within normal range.   CT scan of the chest showed no masses or effusions; suggestion of fatty  liver.   HOSPITAL COURSE:  Please see admission history and physical for details of  her presentation.  Briefly, she was admitted with new, sharp intermittent  left chest pain.  The pain was moderate to severe and in the left mid  sternocostal area.  It did not seem to have any clear exacerbated or  remitting factors.  She ruled out for myocardial infarction on telemetry and  was seen in consultation by Dr. Aleen Campi.  He felt that the pain was  probably noncardiac and might be related to abruptly stopping the Neurontin.  He recommended that she have, as an outpatient, a Persantine Cardiolite  study, as well as a duplex ultrasound of the renal arteries.   Also, during the hospital stay, she was seen by Dr. Virginia Rochester for her month-long  diarrhea, associated with weight loss.  He ordered additional stools.  Surprisingly, she has had no bowel movements so far during the hospital  stay, but will try to leave [place] a sample so that  we can send it for multiple  analyses before she leaves today.   Also during the stay it was noted that her INR was excessively elevated.  For that reason, she will go home on a lower dose of Coumadin and have the  prothrombin time rechecked in the office on Monday.   She also had a low potassium level, which was corrected.   Another possibility as to the source of her pain could be esophageal, as she  had been out of her proton pump inhibitor for about a month.  She was given  intravenous Protonix.  The pain was 75-80% improved by the time of  discharge.   DISPOSITION:  Vanessa Bartlett was discharged to home on the following medications:  1. Darvocet-N 100  one by mouth four times a day, as needed for extra pain     relief.  2. Restoril 15 mg one at bedtime as needed for sleep until she gets more     Zyprexa on Monday.  3. Zyprexa 5 mg p.o. one-half hour before bedtime.  4. Zoloft 50 mg each morning.  5. Lopressor 50 mg (metoprolol) one-half tablet p.o. twice a day.  6. Nitroglycerin patch 0.4 mg/hour.  7. Protonix 40 mg p.o. daily.  8. Coumadin 5 mg tablets one-half tablet Saturday night, one tablet Sunday     night.  Recheck prothrombin time on Monday.  9. She may restart the Neurontin, if desired.  10.      Sublingual nitroglycerin 0.4 mg p.r.n.  11.      Alprazolam 0.5 mg three times a day as needed.   It is also noted that she was initially hyperventilating.  Anxiety may have  played some part in the initial presentation.  Her overall condition was  improved.   IMPRESSION:  1. Chest pain, cause unclear.  2. Gastroesophageal reflux.  3. Hypokalemia, corrected.  4. Initial hyperventilation.  5. Chronic anxiety and depression.  6. History of cerebrovascular accident.  7. Status post aortic and mitral valve replacement - on chronic Coumadin     therapy.  8. Diarrhea.  9. Weight loss.  10.      History of multiple surgeries.     a. Cholecystectomy.     b. Lumbar disc surgery.      c. Appendectomy.                                               Soyla Murphy. Renne Crigler, M.D.    WDP/MEDQ  D:  04/08/2002  T:  04/16/2002  Job:  [id number]   cc:   Soyla Murphy. Renne Crigler, M.D.  Fax: [phone]   Aram Candela. Aleen Campi, M.D.  Fax: [phone]

## 2011-01-30 NOTE — Consult Note (Signed)
[place]. [facility]  Patient:    Vanessa Bartlett, Vanessa Bartlett Visit Number: [id number] MRN: [id number]          Service Type: MED Location: 5500 [id number] [id number] Attending Physician:  Thedore Mins Dictated by:   Sabino Gasser, M.D. Admit Date:  08/21/2001                            Consultation Report  Ms. Suthers is a very nice 70 year old lady who lives in [place], [place] who I have been asked to see because of diarrhea, anorexia, weight loss.  Patient states that the diarrhea started approximately three weeks ago and typically she notices it at any time of the day but usually it is preceded by eating and within a half an hour or so she will have diarrhea one to a couple of times. There have been episodes of vomiting occasionally with this.  She had some vomiting on Sunday at which point she had vomited up about a half an hour after eating some yellowish material.  She states that she has never had problems with diarrhea like this before and during this period of time she has lost 13 pounds of weight.  She denies any blood in her stool, any abdominal pain.  She has had abdominal cramping.  There has been a 13 pound weight loss that she noted over this period of time.  She denies dysphagia.  She does have anorexia.  No desire to eat.  Part of this is because she notes that it causes her diarrhea.  Typically she eats, gets abdominal cramping, and then has diarrhea which relieves the cramping.  She never has nocturnal diarrhea.  She has stated that her stools have been black.  She thinks that at some time she may have had a stomach ulcer and apparently about four years ago had endoscopy and colonoscopy and she tells me that her recall is that those studies were negative.  She did have some trouble eating after she had valvular surgery done last year at [facility] and at that time she did require a PEG placement for feeding.  She does not remember any recent  antibiotic use. She does not have anybody else that she is close to that has diarrhea.  She has not been out of the country nor does she remember eating any tainted foods.  She initially did not recall any new medications but upon further speaking to her she has been on Zoloft which started a couple of months ago, she believes.  PAST MEDICAL HISTORY:  Notable for the fact that she has had two strokes.  One was about 17 years ago and the other one was after surgery last year.  She had an aortic valve replaced, again, at [facility] and has been on Coumadin.  Additionally, as stated, she had been on Zyprexa at bedtime, Zoloft, temazepam, Lopressor for hypertension, Xanax, nitroglycerin patch, Prevacid because of the hiatal hernia, and Neurontin.  She has no history of gynecologic disorders.  FAMILY HISTORY:  Esophageal cancer.  SOCIAL HISTORY:  She lives alone but her brother who was visiting has recently been diagnosed with cancer.  Her best friend has been told of having about six months to live.  She has a son who was diagnosed with leukemia.  All this has occurred in the last three months.  She has four children total, the first of which died at age  2 weeks.  She states that over the past few weeks she has not been feeling well.  She was hospitalized because of vaginal bleeding which apparently may have been partially induced by an increased INR because of the Coumadin.  She does smoke.  Does not drink.  PAST SURGICAL HISTORY:  In the past she has had a cholecystectomy, multiple back surgeries, the CABG and valvular replacement, hysterectomy.  History of atrial fibrillation.  ALLERGIES:  No known drug allergies.  REVIEW OF SYSTEMS:  At this time denies chest pain, shortness of breath.  Has some mild degree of dysarthria since she has had strokes and weakness of the left arm.  PHYSICAL EXAMINATION  GENERAL:  Alert, oriented, as mentioned, slightly  dysarthric.  VITAL SIGNS:  Temperature 96.9, blood pressure 141/73, pulse 66, respiratory rate 18 and unlabored, 94% saturation on room air.  HEENT:  Without jaundice.  She is in no distress.  Awake, alert, oriented.  NECK:  No bruits have been noted in the neck.  HEART:  Regular rhythm with a mechanical click, 2/6 systolic murmur.  LUNGS:  Clear.  ABDOMEN:  Positive bowel sounds.  Abdomen is soft, nontender.  RECTAL:  Stool is brown.  IMPRESSION:  Patient admitted because of vaginal bleeding with an elevated prothrombin time which has been reversed.  Patient has been restarted on her Coumadin.  At this point studies to date to work-up for diarrhea have been negative.  I think that this lady probably has irritable bowel syndrome, possibly some of this might be related to Zoloft since it can cause diarrhea quite frequently.  She states that it has been started just in the last few months.  She has been under a terrible amount of stress given the fact that her brother, best friend have been recently diagnosed with terminal illness and her son has been diagnosed with leukemia all in the last three months. This may account for her weight loss, the fact that she is having diarrhea post prandially, but not nocturnally; but certainly we have to give her the benefit of the doubt.  At this point would want to stop Zoloft to consider endoscopy, colonoscopy at some point.  Will give her Imodium symptomatic treatment for the diarrhea prior to meals and will see if that slows things down.  Just because of the weight loss, though, she may deserve further gastroenterologic evaluation and will discuss further with Dr. Renne Crigler. Dictated by:   Sabino Gasser, M.D. Attending Physician:  Thedore Mins DD:  08/23/01 TD:  08/23/01 Job: [id number]

## 2011-01-30 NOTE — H&P (Signed)
Vanessa Bartlett. [facility]  Patient:    Vanessa, Bartlett Visit Number: [id number] MRN: [id number]          Service Type: MED Location: 3700 3741 [id number] Attending Physician:  Londell Moh Dictated by:   Lemmie Evens, M.D. Admit Date:  04/06/2002 Discharge Date: 04/08/2002                           History and Physical  HISTORY OF PRESENT ILLNESS:  Ms. Vanessa Bartlett is a 70 year old white female patient of Dr. Renne Crigler who came to the emergency room today complaining of persistent upper abdominal and lower chest pain that she has had for the past few days.  The pain seemed to increase in severity on the day of admission.  She does have a bloating feeling throughout the abdomen without episodes of diarrhea, nausea, or vomiting.  She has found it difficult to tolerate food or liquids today. She does have episodes of loose stools intermittently over the past year.  She has lost almost 20 pounds of weight during this time.  She did undergo a GI work-up in the past through Dr. Virginia Rochester which found no major abnormalities.  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  She was left with some degree of speech difficulty and weakness on the right side.  Also, she has had aortic and mitral valve disease secondary to rheumatic heart condition.  She has undergone valve replacement surgery for this.  REVIEW OF SYSTEMS:  The patient has not had a recent history of skin rash, mouth sores, eye problems.  Also, she denies peripheral numbness.  PAST SURGICAL HISTORY:  The patient has undergone previously mentioned surgery with bivalvular replacement as well as lumbar disk surgery three times in the past, vaginal hysterectomy.  Also, she has undergone cholecystectomy and appendectomy.  SOCIAL HISTORY:  The patient does not smoke cigarettes or drink alcohol.  ALLERGIES:  The patient has had no significant  allergy to medicine.  CURRENT MEDICATIONS: 1. Coumadin. 2. Zyprexa 10 mg before bed. 3. Zoloft 50 mg daily. 4. Lopressor 25 mg b.i.d. 5. Prevacid. 6. Neurontin. 7. Xanax 0.5 mg up to t.i.d.  PHYSICAL EXAMINATION  GENERAL:  The patient appears to be healthy and well-nourished.  EXTREMITIES:  Examination of the extremities revealed fairly good hand grip bilaterally.  There is good upper motion of both arms.  No joint swelling is noted peripherally.  NECK:  No thyromegaly or adenopathy.  HEART:  Regular sinus rhythm, heart rate about 80.  There is a valve click noted.  ABDOMEN:  Soft abdomen with some bloating generally and good bowel sounds. There is tenderness throughout the upper abdomen.  LUNGS:  Clear breath sounds bilaterally without rales.  There is good strength in the upper and lower extremity muscle groups.  ASSESSMENT:  The patient has had chest and upper abdominal pain with bloating. She could have esophagitis or gastritis.  Apparently, she has had a hiatal hernia.  She is being admitted for evaluation of the chest and upper abdominal pain.  It is noted that she was seen by Dr. Aleen Campi in the emergency room who will follow her during the hospitalization. Dictated by:   Lemmie Evens, M.D. Attending Physician:  Londell Moh DD:  04/06/02 TD:  04/10/02 Job: [id number]

## 2011-01-30 NOTE — H&P (Signed)
NAME:  Vanessa Bartlett, Vanessa Bartlett                            ACCOUNT NO.:  [id number]   MEDICAL RECORD NO.:  [id number]                   PATIENT TYPE:  INP   LOCATION:  5013                                 FACILITY:  [facility]   PHYSICIAN:  Marlan Palau, M.D.               DATE OF BIRTH:  12/08/1940   DATE OF ADMISSION:  04/17/2002  DATE OF DISCHARGE:                                HISTORY & PHYSICAL   HISTORY OF PRESENT ILLNESS:  Vanessa Bartlett is a 70 year old right-handed  white female born 04/29/1941 with a history of  cerebrovascular disease with  left brain stroke with residual aphasia and a history of low back pain with  lumbosacral spine surgery x 3 in the past.  The patient has chronic right S1  radiculopathy but also has significant discomfort over the right SI joint  radiating pain down the right leg.  This patient has recently had a  lumbosacral myelogram that did not show significant involvement of the right  S1 or L5 nerve roots.  This patient is being brought in to come off Coumadin  and have possible SI joint injection on the right.  The patient again is on  chronic Coumadin therapy for history of aortic valve and mitral valve  replacements with mechanical valves.   PAST MEDICAL HISTORY:  1. History of lumbosacral spine surgery x 3 in the past for chronic right S1     radiculopathy.  2. Ongoing chronic back and right leg pain.  Rule out right SI joint     dysfunction.  3. Aortic valve and mitral valve replacement with mechanical valve.  4. History of rheumatic heart disease.  5. History of gallbladder resection in 1993.  6. History of hysterectomy.  7. History of D&C x 4 in the past.  8. History of bilateral tubal ligation.  9. History of tonsillectomy.  10.      History of appendectomy.  11.      History of right knee surgery in 1993.  12.      History of congenital atrophy of one kidney.  13.      History of cerebrovascular disease and a stroke of left brain with  residual right hemiparesis and aphasia.  14.      History of hypertension.   MEDICATIONS PRIOR TO ADMISSION:  1. Neurontin 600 mg 1 p.o. q.i.d.  2. Metoprolol 50 mg daily.  3. Sublingual nitroglycerin 0.4 mg p.r.n. chest pain.  4. Nitroglycerin patch 0.4 mg per hour 1 patch daily.  5. Prevacid 30 mg daily.  6. Zoloft 100 mg a day.  7. Vicodin 1 every 3 to 4 hours as needed for pain.  8. Restoril 15 mg a night if needed for sleep.   ALLERGIES:  No known drug allergies.   HABITS:  Smokes one-third of a pack of cigarettes per day, does not drink  alcohol.  SOCIAL HISTORY:  The patient has been married since 1962, widowed in 1996.  She has a high school education, is on disability.   FAMILY MEDICAL HISTORY:  Mother died at age 84 with cancer, father died at  age 6 with cancer.  The patient has a brother who died at age 41 with heart  disease, a half sister who died at age 34 with diabetes, a half sister who  died at age 27 with heart disease.  The patient has three children.  There  is a family history of hypertension, diabetes, heart disease.   REVIEW OF SYSTEMS:  Notable for ongoing problems with low back pain, right  leg pain.  The pain is worse with walking.  The patient denies shortness of  breath, does have chronic speech problems.  Denies headaches, visual field  changes.   PHYSICAL EXAMINATION:  VITAL SIGNS:  Blood pressure 136/71, heart rate 72  and regular, respiratory rate 20, temperature afebrile.  GENERAL:  This patient is a fairly well developed white female who is alert,  aphasic at the time of examination.  HEENT:  Head is atraumatic.  Eyes: Pupils are equal, round, and reactive to  light.  LUNGS:  The patient has clear lung fields.  NECK:  Supple.  No carotid bruits noted.  CARDIOVASCULAR:  Examination reveals a mechanical click.  The patient has a  regular rate and rhythm.  EXTREMITIES:  Without significant edema.  NEUROLOGIC:  Cranial nerves as above.   Facial symmetry is present.  The  patient clearly has hesitancy of speech, word finding problems.  Visual  fields are full.  The patient has some decreased pinprick sensation in the  right face as compared to the left.  Again, extraocular movements are full.  The patient has good strength in all fours.  Good symmetric motor tone is  noted throughout.  Sensory testing reveals some decreased pinprick sensation  in the right arm and right leg as compared to the left.  Vibratory sensation  is symmetric in upper extremities,depressed on the right leg as compared to  the left.  Deep tendon reflexes are fairly symmetric with absence of ankle  jerk reflex on the right as compared to the left.  Toes neutral bilaterally.  The patient has slight ataxia on finger-nose-finger with right arm, more  normal on the left.  Slight ataxia toe-to-finger on the right leg, more  normal on the left.  The patient was not ambulated.  No pronator drift is  seen.   LABORATORY DATA:  Laboratory values are pending at this time.   EKG is pending.   IMPRESSION:  1. Chronic low back pain, right leg pain.  2. History of aortic valve mitral valve replacement with mechanical valve on     Chronic Coumadin therapy.   This patient will be admitted at this point to come off Coumadin, go on  heparin, and consider SI joint injection on the right for pain control.  The  patient's pain has been severe.  The patient has been followed by Dr. Thyra Breed, but he would not perform the above procedure without the patient  coming off Coumadin.   PLAN:  1. Discontinue Coumadin.  2. IV heparin therapy.  3.     Anesthesiology consult.  4. Consider vitamin K administration.  5. Will follow daily pro times.  Marlan Palau, M.D.    CKW/MEDQ  D:  04/17/2002  T:  04/21/2002  Job:  [id number]   cc:   [facility]  [address]

## 2011-01-30 NOTE — Procedures (Signed)
[facility]  Patient:    Vanessa Bartlett, Vanessa Bartlett Visit Number: [id number] MRN: [id number]          Service Type: END Location: ENDO Attending Physician:  Sabino Gasser Dictated by:   Sabino Gasser, M.D. Proc. Date: 10/26/01 Admit Date:  10/26/2001                             Procedure Report  PROCEDURE:  Upper endoscopy.  INDICATIONS:  Hemoccult positivity, chronic diarrhea.  ANESTHESIA:  Demerol 75, Versed 10 mg.  DESCRIPTION OF PROCEDURE:  With patient mildly sedated in the left lateral decubitus position, the Olympus videoscopic endoscope was inserted into the mouth and passed under direct vision through the esophagus which appeared normal.  Distal esophagus showed no evidence of Barretts esophagus.  This was photographed.  We entered into the stomach.  Fundus, body, antrum, duodenal bulb, and second portion of duodenum were visualized.  I went as far as I could to advance the endoscope into the intestinal tract.  This grossly appeared normal endoscopically, but biopsies were taken and, from this point, the endoscope was then slowly withdrawn, taking circumferential views of the entire duodenal mucosa until the endoscope then pulled back into the stomach and placed in retroflexion to view the stomach from below, and a hiatal hernia was seen and photographed.  The endoscope was then straightened and withdrawn, taking circumferential views of the remaining gastric and esophageal mucosa, stopping in the fundus of the stomach where some folds were seen to be thickened and erythematous, slightly edematous.  These were photographed and multiple biopsies taken.  The patients vital signs and pulse oximeter remained stable.  The patient tolerated the procedure well without apparent complications.  FINDINGS: 1. Erythema of fundus of stomach, notably in the folds of the stomach    consistent with gastritis.  These were photographed and biopsied. 2. Small hiatal  hernia. 3. Otherwise unremarkable examination.  PLAN:  We will await biopsy report.  The patient will call me for results and follow up with me as an outpatient.  Of note, preoperatively the patient received 2 g of ampicillin and 80 mg of gentamicin. Dictated by:   Sabino Gasser, M.D. Attending Physician:  Sabino Gasser DD:  10/26/01 TD:  10/26/01 Job: [id number]

## 2011-01-30 NOTE — Consult Note (Signed)
[name]  Patient:    Vanessa Bartlett, Vanessa Bartlett Visit Number: [id number] MRN: [id number]          Service Type: MED Location: [id number] Attending Physician:  Thedore Mins Dictated by:   Pershing Cox, M.D. Proc. Date: 08/22/01 Admit Date:  08/21/2001                            Consultation Report  REASON FOR CONSULTATION:  Postmenopausal vaginal bleeding.  HISTORY OF PRESENT ILLNESS:  The patient is a 70 year old, gravida 4, para 53 Caucasian female who was admitted on August 21, 2001, for failure to thrive. She has been unable to eat for four weeks and has had perfuse diarrhea. At the time of admission, she also complained of four days of vaginal bleeding which she described as red and heavy. The patient is not coitally active. She is status post hysterectomy. She has not had a Pap smear since the time of her hysterectomy at 70 years of age.  GYNECOLOGIC HISTORY:  Significant for tubal ligation followed by a vaginal hysterectomy. Her ovaries are in place. She has had no history of an abnormal Pap smear and no recent coitus.  ALLERGIES:  None.  MEDICATIONS: 1. Coumadin 5 mg. 2. Zyprexa 5 mg. 3. Zoloft 50 mg. 4. Lopressor 25 mg b.i.d. 5. Nitroglycerin p.r.n. 6. Prevacid 30 mg q.d. 7. Neurontin 600 mg q.i.d. 8. Xanax 0.5 mg p.r.n.  SERIOUS MEDICAL ILLNESSES:  Status post CVA x 2, history of valvular heart disease with replacement, history of rheumatic heart disease, history of hiatal hernia.  PAST SURGICAL HISTORY:  Mitral valve replacement, cholecystectomy, lumbar disk surgery x 3, vaginal hysterectomy, bilateral tubal ligation.  SOCIAL HISTORY:  The patient is disabled. She [place]  REVIEW OF SYSTEMS:  Frequent diarrhea and anorexia x 4 weeks. The patient specifically denies intercourse and denies any stress or injury of her vulva.  PHYSICAL EXAMINATION:  VITAL SIGNS:  Stable.  BACK:   No CVA tenderness. There is a scar consistent with a previous laminectomy.  ABDOMEN:  Soft, nontender. No guarding. No rebound. Normal bowel sounds. Midline scar in the sub umbilical region. There is no hernia.  PELVIC:  Bilateral vulvar swelling, right greater than left; bilateral vulvar ecchymosis but no bleeding site from the vulva noted. The vagina:  There is a discharge with odor. Wet prep was collected. A Pap was taken from the vaginal apex. Bimanual:  No palpable masses. Rectovaginal exam confirms above.  LABORATORY DATA:  Hemoglobin 14.7. Pro time 47, with an INR of 8.7.  ASSESSMENT: 1. Coagulopathy, probably contributing to vaginal bleeding. Postmenopausal    vaginal bleeding status post hysterectomy. There is no clear etiology for    this bleeding. There is absolutely no evidence of bleeding from the vagina.    The bleeding may be coming from a vulvar site. However, there is nothing on    the vulva to suggest malignancy and no specific bleeding site noted. Rule    out intravesicle and gastrointestinal bleeding. 2. Vulvar ecchymoses.  RECOMMENDATION: 1. Check Pap and wet prep and treat appropriately. 2. Soaks for the patients vulva with follow-up after her discharge from the    hospital to take a closer look at the vulva if the ecchymoses and swelling    does not recur. Will reassess at any time that Soyla Murphy. Renne Crigler, M.D.    requests  evaluation. Dictated by:   Pershing Cox, M.D. Attending Physician:  Thedore Mins DD:  08/22/01 TD:  08/22/01 Job: [id number]

## 2011-01-30 NOTE — Procedures (Signed)
[facility]  Patient:    Vanessa Bartlett, Vanessa Bartlett Visit Number: [id number] MRN: [id number]          Service Type: END Location: ENDO Attending Physician:  Sabino Gasser Dictated by:   Sabino Gasser, M.D. Admit Date:  10/26/2001                             Procedure Report  PROCEDURE:  Colonoscopy.  INDICATIONS:  Diarrhea, hemoccult positivity.  ANESTHESIA:  Additional Demerol 25 mg.  Of note as previously, the patient had received ampicillin 2 gm and gentamicin 80 mg prior to this procedure.  PROCEDURE: With the patient mildly sedated in the left lateral decubitus position, the Olympus videoscopic colonoscope was inserted in the rectum and passed under direct vision to the cecum, identified by the ileocecal valve and appendiceal orifice.  We entered into the terminal ileum, which appeared normal. It was photographed, and biopsy was taken to confirm this and also for evaluation of her diarrhea.  The endoscope was then withdrawn, taking circumferential views of the remaining colonic and terminal ileal mucosa, stopping only to take random biopsies along the way until we reached the rectum, and at approximately 2 cm from the anal verge, a small polyp was seen, photographed, and removed using hot biopsy forceps technique, setting it at a 3 and 3 blended current.  There was good hemostasis.  The endoscope was pulled back to the rectum, which appeared normal on direct view and showed hemorrhoids on retroflexed view.  The endoscope was straightened and withdrawn.  The patients vital signs and pulse oximeter remained stable.  The patient tolerated the procedure well without apparent complications.  FINDINGS:  Small polyp at 20 cm from the anal verge, internal hemorrhoids. Otherwise, an unremarkable examination including the terminal ileum.  PLAN: We will await biopsy report. The patient will call me for results and follow up with me as an outpatient.  Will resume Coumadin  therapy. Dictated by:   Sabino Gasser, M.D. Attending Physician:  Sabino Gasser DD:  10/26/01 TD:  10/26/01 Job: [id number]

## 2011-01-30 NOTE — H&P (Signed)
[name]  Patient:    Vanessa Bartlett, Vanessa Bartlett Visit Number: [id number] MRN: [id number]          Service Type: MED Location: [id number] Attending Physician:  Londell Moh Dictated by:   Lemmie Evens, M.D. Admit Date:  04/06/2002 Discharge Date: 04/08/2002                           History and Physical  HISTORY OF PRESENT ILLNESS:  Vanessa Bartlett is a 70 year old white female, patient of Dr. Renne Crigler, who came to the emergency room today complaining of persistent upper abdominal and lower chest pain that she has had for the past few days. The patient seemed to increase in severity on the day of admission.  She does have a bloating feeling throughout the abdomen without episodes of diarrhea, nausea, or vomiting.  She has found it difficult to tolerate food or liquids today.  She does have episodes of loose stools intermittently over the past year.  She has lost almost 20 pounds of weight during this time.  She did undergo a GI work-up in the past through Dr. Virginia Rochester, which found no major abnormalities.  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  She was left with some degree of speech difficulty and weakness on the right side.  Also, she has had aortic and mitral valve disease secondary to rheumatic heart condition.  She has undergone valve replacement surgery for this.  The patient has not had a recent history of skin rash, mouth sores, eye problems.  Also, she denies peripheral numbness.  The patient has undergone previously mentioned surgery with bivalvular replacement as well as lumbar disk surgery three times in the past, vaginal hysterectomy.  Also, she has undergone cholecystectomy and appendectomy.  HABITS:  The patient does not smoke cigarettes or drink alcohol.  ALLERGIES:  The patient has had no significant allergy to medicine.  CURRENT  MEDICINES: 1. Coumadin. 2. Zyprexa 10 mg before bed. 3. Zoloft 50 mg q.d. 4. Lopressor 25 mg b.i.d. 5. Prevacid. 6. Neurontin. 7. Xanax 0.5 mg up to t.i.d.  PHYSICAL EXAMINATION:  GENERAL:  The patient appears to be healthy and well-nourished.  EXTREMITIES:  Fairly good hand grip bilaterally.  There was good upper motion of both arms.  No joint swelling is noted peripherally.  NECK:  No thyromegaly or adenopathy.  HEART:  Regular sinus rhythm.  Heart rate of about 80.  There is a valve click noted.  ABDOMEN:  Soft abdomen with some bloating generally and good bowel sounds. There is tenderness throughout the upper abdomen.  LUNGS:  Clear breath sounds bilaterally without rales.  MUSCULOSKELETAL:  There is good strength in the upper and lower extremity muscle groups.  ASSESSMENT:  The patient has had chest and upper abdominal pain with bloating. She could have esophagitis or gastritis.  Apparently she has had a hiatal hernia.  She is being admitted for evaluation of the chest and upper abdominal pain.  It is noted that she was seen by Dr. Aleen Campi in the emergency room, who will follow during the hospitalization. Dictated by:   Lemmie Evens, M.D. Attending Physician:  Londell Moh DD:  04/06/02 TD:  04/10/02 Job: [id number]

## 2011-01-30 NOTE — Discharge Summary (Signed)
NAME:  Vanessa, Bartlett                            ACCOUNT NO.:  [id number]   MEDICAL RECORD NO.:  [id number]                   PATIENT TYPE:  INP   LOCATION:  3004                                 FACILITY:  [facility]   PHYSICIAN:  Vanessa Bartlett, M.D.               DATE OF BIRTH:  09-06-1941   DATE OF ADMISSION:  06/09/2002  DATE OF DISCHARGE:  06/17/2002                                 DISCHARGE SUMMARY   ADMISSION DIAGNOSES:  1. 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  2. History of mechanical valve, aortic and mitral, on chronic Coumadin     therapy.  3. Chronic back and right leg pain with lumbosacral radiculopathy.  4. Status post recent fall.   DISCHARGE DIAGNOSES:  1. Psychogenic right hemiparesis.  2. Underlying anxiety and depression.  3. 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  4. Hypertension.  5. Chronic low back pain and right leg pain.   PROCEDURES:  1. CT scan of the brain x3.  2. Carotid Doppler study.  3. 2D echocardiogram.   COMPLICATIONS:  Complications to above procedures none.   CONSULTATIONS:  Antonietta Breach, M.D., Psychiatry.   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 The patient  has been on chronic Coumadin therapy and came in to the hospital with right  sided weakness and stuttering speech. The patient lives alone and had been  independent in terms of activities of daily living. The patient fell down  some steps going into her house, her right leg gave-way, the patient went  down the steps, striking the occiput of her head, right knee and came to the  emergency room with the right sided weakness. CT scan of the brain showed  old left brain stroke, no new evidence of stroke, no evidence of  intracranial hemorrhage or contusion.   PAST MEDICAL HISTORY:  1. 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  2. Status post aortic valve replacement and mitral valve replacement with     mechanical valves.  3. Chronic Coumadin therapy.  4. History of rheumatic heart disease.  5. Congenital atrophy of one kidney.  6. History of prior stroke left brain with right hemiparesis, aphasia.  7. Hypertension.  8. Cigarette abuse in the past.  9. History of cholecystectomy.  10.      Hysterectomy.  11.      History of D&C.  12.      Bilateral tubal ligation.  13.      Tonsillectomy.  14.      Appendectomy.  15.  Right knee surgery in 1993.  16.      Anxiety and depression.   CURRENT MEDICATIONS:  1. Neurontin 600 mg q.i.d.  2. Darvocet p.r.n. one q.i.d.  3. Amitriptyline 25 mg two q.h.s.  4. Metoprolol 50 mg one half tablet twice a day.  5. Zoloft 100 mg q. day.  6. Coumadin 5 mg q. day.  7. OxyContin 10 mg t.i.d.  8. Prevacid 30 mg q. day.  9. Nitroglycerin patch 0.4 mg per hour one q. day.   ALLERGIES:  No known allergies.   SOCIAL HISTORY:  She smokes 3-4 cigarettes a day. She does not drink  alcohol.   PHYSICAL EXAMINATION:  Please refer to history and physical dictation  summary for social history, family history, review of systems and physical  examination.   LABORATORY AND ACCESSORY DATA:  Laboratory values are notable for an INR of  3.8. The patient has CBC notable for a white count of 6.1, hemoglobin of  11.3, hematocrit of 34.1, MCV 91.9, platelets of 111,000. The patient has  homocysteine level of 27.15. Sodium 140, potassium 4.0, chloride 106, CO2  28, glucose 90, BUN 20, creatinine 1.5, calcium 8.9. Cholesterol 200, HDL  51, LDL 122, VLDL 27. Urinalysis reveals a specific gravity 1.024, pH 5.0, 3-  6 white cells, 3-6 red cells. Urine culture shows staph coagulase negative  sensitive to entire panel with exception of  penicillin. Only 5,000 colonies  were noted in the urine.   CT of the head shows encephalomalacia in the appearance of the cortices  bilaterally, left greater than right. No evidence of mass effect; chronic  changes noted.   HOSPITAL COURSE:  The patient was admitted to [facility]. The  patient had three CT scan studies done due to the history of head trauma and  persistent symptoms. All head scans showed only chronic changes, no new  changes were seen. No intracranial hemorrhage was seen, no subdural was  noted. The patient was set up for a workup, although, the weakness on the  right side was felt to be embellished or psychogenic in nature. The carotid  Doppler study revealed no significant internal carotid artery stenosis on  the right, 46% internal carotid artery stenosis on the left, in the low to  mid range. Vertebral artery flow was antegrade bilaterally. 2D  echocardiogram does show evidence of mechanical valves and ejection fraction  of 40-50%. The patient has what appeared to be a St. 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  Jude mechanical prosthesis, also noted, the left atrial size upper limits of  normal and right ventricle was mildly to moderately dilated. There was mild  to moderate tricuspid valvular regurgitation seen. EKG reveals incomplete  right bundle branch block, undetermined rhythm, nonspecific ST wave  abnormalities, heart rate of 79. There was unusual P axis ectopic atrial  rhythm with premature ventricular complexes seen.   This patient was admitted to [facility] and did undergo a series of  different CT scans, again with no evidence of intracranial trauma or new  stroke demonstrated. The patient continued to have give-way weakness  embellishment of examination. Speech was at baseline. The patient was thought to have psychogenic onset of her weakness. The patient was seen by  physical,  occupational and speech therapies. The patient initially was  unable to walk at all; would not attempt to even bear weight on the right  leg.   After  discussion with the patient, it was determined that this patient was  under extreme stress taking care of a sick brother who is expected to die,  in fact, has passed away on the day of this dictation. The patient has lost  within the last nine months her fiance' and her son. The patient is felt to  have significant anxiety and depression and grief response. The patient was  seen by Dr. Jeanie Sewer from psychiatry who recommended an increase in her  Zoloft dosing. The patient will need counseling following discharge. The  patient has been set up with [facility] with an appointment  on 06/29/02 at 2:00.   The INR initially during this hospitalization was a bit on the low side with  an INR of 1.9. The patient was given an increase dose of the Coumadin  temporarily and an INR overshot the mark with 3.8. The patient is now being  held on the Coumadin and will return back to her dose of 5 mg daily within  two days following this discharge. The patient will have blood work checked  for her Coumadin within five days from discharge.   DISCHARGE MEDICATIONS:  1. Coumadin 5 mg one a day. (The dose will be held on 06/17/02 and 06/18/02     and restart on 06/19/02).  2. Amitriptyline 25 mg two at night.  3. Zoloft will be increased to 150 mg a day.  4. Toprol XL 25 mg twice a day.  5. OxyContin 20 mg one twice a day. (This was increased from admission due     to severe pain).  6. Prevacid 30 mg one a day.  7. Nitro-Dur patch 0.4 mg/hr one patch daily.  8. Ambien 10 mg one at night as needed for sleep.  9. Neurontin 600 mg one four times a day.  10.      The patient has Darvocet at home to take if needed.   DISCHARGE INSTRUCTIONS:  1. No added salt diet.  2. Will have a rolling walker for ambulation. The patient complains of      severe pain with weight bearing involving the back and right leg. The     walker may help alleviate some of the pressure on the leg to help the     pain and help balance it.  3. The patient will follow up with [facility] within     three weeks following discharge and again, will follow up with mental     health. The patient again, will go home with a rolling walker. The     patient is to contact the office if any problems arise.   PATIENT'S DISPOSITION:  The patient seems to be quite concerned about going  back home at this point; seems to be quite depressed  and nervous. The  patient does not appear to be coping well with the recent illnesses and  deaths in the family.                                               Vanessa Bartlett, M.D.    CKW/MEDQ  D:  06/17/2002  T:  06/21/2002  Job:  [id number]

## 2011-01-30 NOTE — Discharge Summary (Signed)
NAME:  Vanessa Bartlett, Vanessa Bartlett                            ACCOUNT NO.:  [id number]   MEDICAL RECORD NO.:  [id number]                   PATIENT TYPE:  INP   LOCATION:  5013                                 FACILITY:  [facility]   PHYSICIAN:  Candy Sledge, M.D.            DATE OF BIRTH:  1940/09/23   DATE OF ADMISSION:  04/17/2002  DATE OF DISCHARGE:  04/23/2002                                 DISCHARGE SUMMARY   HISTORY:  For complete details of chief complaint, history of present  illness, past medical history and the physical examination please refer to  Dr. Lesia Sago' admission history and physical.  Briefly, Vanessa Bartlett is a  70 year old right-handed female with a history of left brain stroke with  residual aphagia, as well as a history of low back pain with lumbosacral  spine surgery x3 in the past.  She also is on chronic Coumadin therapy for  aortic valve and mitral valve replacements.  She has had problems with a  chronic right S1 radiculopathy with pain over the right sacroiliac joint  region, radiating down into the posterior right leg.  The patient has been  fully responsive to outpatient therapy and was brought into the hospital for  consideration of a right SI joint injection.  This could not be performed as  an outpatient as the patient had to go off of Coumadin and remain on Heparin  prior to the procedure.   LABORATORY DATA:  Patient's CBC and differential showed a white blood count  of 8.3 thousand, hemoglobin 13.2 and hematocrit 35.5.  Platelet count was  166,000.  There are serial CBC's with the patient on Heparin and showed  essentially stable hemoglobin.  The platelet count did drop at one point to  as low as 99,000 but then came up to 135,000 on Heparin.  PT and PTT  initially were 28.0 and 45 seconds respectively with an INR of 3.2.  Off of  Coumadin and with an injection of vitamin K, the INR dropped down to 1.5 and  then 1.0.  PTTs were in the high therapeutic ranges,  generally on heparin.  Metabolic panel on admission showed alkaline phosphatase slightly elevated  at 119 U per liter although the indices were within normal limits.  A urine  culture shows 8,000 colonies per milliliter, felt to be insignificant.  A 12  lead EKG showed unusual P axis, possible ectopic atrial rhythm with frequent  premature ventricular complexes and nonspecific ST/T wave abnormalities.  An  SI joint injection was performed under fluoroscopic control on April 18, 2002, and __________  successful.   HOSPITAL COURSE:  The patient was admitted to 5,000 units.  She was  administered vitamin K with rapid normalization of her prothrombin time.  She underwent an SI joint injection on April 18, 2002, and tolerated the  procedure well.  Unfortunately, she did not obtain rapid or significant  relief of her pain with the injection over the next several days.  She was  started back on Coumadin subsequently and her hospital course otherwise was  quite uneventful.  Vital signs remained stable throughout.  On April 23, 2002, her INR was therapeutic at 2.4.  In the meantime she had been started  on OxyContin 20 mg p.o. b.i.d. for her chronic pain and was maintained on  her previous medications.  The patient is discharged on August 10.   FINAL DIAGNOSES:  1. Chronic low back and right leg pain with known S1 radiculopathy.  2. She is status post right sacroiliac joint injection with Depo-Medrol.  3. History of left brain stroke.  4. Status post aortic and mitral valve replacement, on chronic Coumadin.  5. History of rheumatic heart disease.  6. History of lumbosacral surgery x3.   DISCHARGE MEDICATIONS:  Neurontin 600 mg p.o. q.i.d., Zyprexa 2.5 mg p.o.  q.h.s.  The patient is not currently taking this due to the expense of the  medication.  She is on Lopressor 50 mg p.o. q.d., nitroglycerin 0.4 mg per  hour patch, Protonix 40 mg p.o. q.d., Zoloft 100 mg p.o. q.d., Restoril 30  mg  p.o. q.h.s., Coumadin 5 mg q.o.d. alternating with 2.5 mg p.o. q.o.d.,  monitored by pro times obtained through Dr. Aleen Campi, OxyContin 20 mg p.o.  b.i.d., Vicodin 1 every 6 hours as needed for breakthrough pain.   DISPOSITION:  The patient is discharged to home with instructions to take  Coumadin 2.5 mg on August 10, 5 mg on August 11 and check a pro time on  August 12.  She is to followup with Dr. Anne Hahn in 3-4 weeks.  Condition on  discharge is stable.   PROGNOSIS:  Fair.                                               Candy Sledge, M.D.    [id number]  D:  04/23/2002  T:  04/27/2002  Job:  [id number]   cc:   Marlan Palau, M.D.   John R. Aleen Campi, M.D.  Fax: [phone]

## 2011-01-30 NOTE — H&P (Signed)
[place]  Patient:    Vanessa Bartlett, Vanessa Bartlett Visit Number: [id number] MRN: [id number]          Service Type: MED Location: 3700 [id number] Attending Physician:  Londell Moh Dictated by:   Lemmie Evens, M.D. Admit Date:  04/06/2002 Discharge Date: 04/08/2002                           History and Physical  HISTORY OF PRESENT ILLNESS:  The patient is a 70 year old white female, patient of Dr. Soyla Murphy. Pharr, who came to the emergency room today complaining of persistent upper abdominal and lower chest pain that she has had for the past few days.  The pain seemed to increase in severity on the day of admission.  She does have a bloating feeling throughout the abdomen without episodes of diarrhea, nausea, or vomiting.  She has found it difficult to tolerate food or liquids today.  She does have episodes of loose stools intermittently over the past year.  She has lost almost 20 pounds of weight during this time.  She did undergo a GI workup in the past through Dr. Sabino Gasser, which found no major abnormalities.  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  She was left with some degree of speech difficulty and weakness on the right side.  Also, she has had aortic and mitral valve disease secondary to rheumatic heart condition.  She has undergone valve replacement surgery for this.  The patient has not had a recent history of skin rash, mouth sores, eye problem.  Also, she denies peripheral numbness.  The patient has undergone previously mentioned surgery with bivalvular replacement as well as lumbar disc surgery three times in the past.  Vaginal hysterectomy.  She has also undergone cholecystectomy and appendectomy.  HABITS:  The patient does not smoke cigarettes or drink alcohol.  ALLERGIES:  The patient has had no significant allergy to  medicine.  CURRENT MEDICATIONS: 1. Coumadin. 2. Zyprexa 10 mg before bed. 3. Zoloft 50 mg q.d. 4. Lopressor 25 mg b.i.d. 5. Prevacid. 6. Neurontin. 7. Xanax 0.5 mg up to three times a day.  PHYSICAL EXAMINATION:  GENERAL:  The patient appears to be healthy and well-nourished.  EXTREMITIES:  Revealed fairly good hand grip bilaterally.  There is good upper motion on both arms.  No joint swelling is noted peripherally.  NECK:  No thyromegaly or adenopathy.  HEART:  Irregular sinus rhythm.  Heart rate of about 80.  There is a valve click noted.  ABDOMEN:  Revealed soft abdomen with some bloating generally and good bowel sounds.  There is tenderness throughout the upper abdomen.  LUNGS:  Clear breath sounds bilaterally without rales.  EXTREMITIES:  There is good strength in the upper and lower extremity muscle groups.  ASSESSMENT:  The patient has had chest and upper abdominal pain with bloating. She could have esophagitis or gastritis.  Apparently, she has had a hiatal hernia.  She is being admitted for evaluation of the chest and upper abdominal pain.  It is noted that she was seen by Dr. Aleen Campi in the emergency room, who will follow her during the hospitalization. Dictated by:   Lemmie Evens, M.D. Attending Physician:  Londell Moh DD:  04/06/02 TD:  04/10/02 Job: [id number]

## 2011-01-30 NOTE — Discharge Summary (Signed)
. [facility]  Patient:    Vanessa Bartlett, Vanessa Bartlett Visit Number: [id number] MRN: [id number]          Service Type: MED Location: 5500 [id number] [id number] Attending Physician:  Londell Moh Dictated by:   Soyla Murphy. Renne Crigler, M.D. Admit Date:  08/21/2001 Discharge Date: 08/29/2001   CC:         Pershing Cox, M.D.   Discharge Summary  LABORATORY RESULTS:  INR at time of discharge 2.9.  Initial INR 22.3.  Initial hemoglobin 16.0; on December 11 it was 12.6.  Occult blood negative.  CMET normal.  CK, CK-MB, and troponins were normal.  Stool for ova and parasites and C. difficile negative.  Urinalysis showed a few calcium oxalate crystals. Wet prep of vaginal secretions with no white cells seen, no Trichomonas seen, no yeast seen, and no clue cells.  Cytopathology within normal limits of vaginal thin prep.  Radiology reports:  Chest x-ray revealed cardiac enlargement, no acute distress.  EKG:  Atrial fibrillation, few PVCs.  QT interval increased since last EKG.  HOSPITAL COURSE:  Please see admission History and Physical for details of her presentation.  Briefly, she presented with vaginal bleeding.  She had a tremendously elevated protime, the cause of which was unclear.  She is on chronic Coumadin therapy.  She was seen in consultation by Dr. Carey Bullocks of gynecology.  No clear etiology of the bleeding was seen.  It may be coming from a vulvar site as she had a lot of irrigation there.  There was nothing to suggest any malignancy and no bleeding sites were seen.  Wet prep and a Pap smear were sent.  Will double check ecchymoses and swelling after treatment with Monostat following hospital stay.  Also, she was seen for nausea, vomiting, and anorexia by Dr. Virginia Rochester of gastroenterology.  He noted no rectal bleeding and gave her symptomatic Imodium.  She had been under a lot of stress and may have had a viral gastroenteritis.  At any rate, her symptoms were  greatly improved by time of discharge.  Initially treated with vitamin K.  It became very difficult to re-anticoagulate her, and she had Lovenox therapy until her protimes came back in the therapeutic range.  IMPRESSION:  1. Vaginal bleeding.  2. Overanticoagulation.  3. Diarrhea, anorexia, weight loss.  4. Depression.  5. Chronic Coumadin therapy.  6. Atherosclerotic cardiovascular disease - stable.  7. History of cerebrovascular accident x 2.  8. History of gastroesophageal reflux.  9. Status post mitral valve replacement, with goal INR being between 2.5 and     3.0 rather than 2.5 and 3.5. 10. History of back surgery.  DISPOSITION:  She is discharged to home on the following medications.  DISCHARGE MEDICATIONS:  1. Remeron 15 mg at bedtime.  2. Coumadin 3 mg every evening.  3. Zyprexa 5 mg at bedtime.  4. Lopressor 25 mg twice a day (metoprolol).  5. Neurontin 300 mg four times a day.  6. Prevacid 30 mg each morning, an hour before food.  7. Nitroglycerin sublingual p.r.n.  8. Nitroglycerin patch 0.4 mg daily - on in the morning, off 12 hours later.  9. Monostat cream as needed.  DIET:  She will stay on a low cholesterol Coumadin diet.  SPECIAL INSTRUCTIONS:  She is not to take any further Zoloft.  Take Restoril (temazepam) only if needed for sleep - should not take on a regular basis.  FOLLOW-UP:  She will see me on Friday,  have a protime in two days in the office on Wednesday, and see Dr. Virginia Rochester in the office in late December. Dictated by:   Soyla Murphy. Renne Crigler, M.D. Attending Physician:  Londell Moh DD:  08/29/01 TD:  08/29/01 Job: [id number] [id number]

## 2011-06-16 LAB — DIFFERENTIAL
Monocytes Absolute: 0.3
Monocytes Relative: 6
Neutro Abs: 3.2
Neutrophils Relative %: 69

## 2011-06-16 LAB — URINE MICROSCOPIC-ADD ON

## 2011-06-16 LAB — CBC
HCT: 43.7
Hemoglobin: 14.8
MCV: 93.9
Platelets: 118 — ABNORMAL LOW
RBC: 4.66
WBC: 4.7

## 2011-06-16 LAB — URINALYSIS, ROUTINE W REFLEX MICROSCOPIC
Glucose, UA: NEGATIVE
Ketones, ur: NEGATIVE
Nitrite: NEGATIVE
pH: 6.5

## 2011-06-16 LAB — PROTIME-INR
INR: 1.8 — ABNORMAL HIGH
Prothrombin Time: 22.2 — ABNORMAL HIGH

## 2011-06-16 LAB — COMPREHENSIVE METABOLIC PANEL
ALT: 22
AST: 50 — ABNORMAL HIGH
Albumin: 3.8
Alkaline Phosphatase: 145 — ABNORMAL HIGH
Calcium: 9.6
Creatinine, Ser: 1.6 — ABNORMAL HIGH
GFR calc non Af Amer: 32 — ABNORMAL LOW
Glucose, Bld: 86
Potassium: 4.5
Total Bilirubin: 0.7
Total Protein: 7.1

## 2011-06-16 LAB — CK: Total CK: 72

## 2011-06-24 ENCOUNTER — Ambulatory Visit: Payer: Medicare Other | Admitting: Cardiology

## 2011-08-19 ENCOUNTER — Encounter: Payer: Self-pay | Admitting: Cardiology

## 2011-08-19 ENCOUNTER — Encounter: Payer: Self-pay | Admitting: *Deleted

## 2011-08-20 ENCOUNTER — Encounter: Payer: Self-pay | Admitting: Cardiology

## 2011-08-20 ENCOUNTER — Ambulatory Visit (INDEPENDENT_AMBULATORY_CARE_PROVIDER_SITE_OTHER): Payer: Medicare Other | Admitting: Cardiology

## 2011-08-20 VITALS — BP 138/73 | HR 70 | Wt 136.0 lb

## 2011-08-20 DIAGNOSIS — F17200 Nicotine dependence, unspecified, uncomplicated: Secondary | ICD-10-CM

## 2011-08-20 DIAGNOSIS — I359 Nonrheumatic aortic valve disorder, unspecified: Secondary | ICD-10-CM

## 2011-08-20 DIAGNOSIS — I059 Rheumatic mitral valve disease, unspecified: Secondary | ICD-10-CM

## 2011-08-20 DIAGNOSIS — Z954 Presence of other heart-valve replacement: Secondary | ICD-10-CM

## 2011-08-20 DIAGNOSIS — I10 Essential (primary) hypertension: Secondary | ICD-10-CM

## 2011-08-20 NOTE — Assessment & Plan Note (Signed)
Continue Coumadin. Monitored by primary care. Check CBC. Continue SBE prophylaxis. Repeat echocardiogram when she returns in one year. 

## 2011-08-20 NOTE — Patient Instructions (Signed)
Your physician recommends that you return for lab work TODAY  Your physician wants you to follow-up in: 12 months You will receive a reminder letter in the mail two months in advance. If you don't receive a letter, please call our office to schedule the follow-up appointment.

## 2011-08-20 NOTE — Progress Notes (Signed)
[id number] female for fu of aortic and mitral valvular disease. Patient status post aortic valve replacement and mitral valve replacement in 1990. Last echocardiogram in Oct 2011 showed normal LV function, prosthetic aortic and mitral valves. Myoview in Oct 2011 showed an ejection fraction of 69% and no ischemia. I last saw her in Oct of 2011. Since then, the patient has dyspnea with more extreme activities but not with routine activities. It is relieved with rest. It is not associated with chest pain. There is no orthopnea, PND or pedal edema. There is no syncope or palpitations. There is no exertional chest pain. Occasional dizziness with standing.    Current Outpatient Prescriptions  Medication Sig Dispense Refill  . amitriptyline (ELAVIL) 25 MG tablet 3 tabs po qd       . aspirin 81 MG tablet Take 81 mg by mouth daily.        Marland Kitchen gabapentin (NEURONTIN) 800 MG tablet Take 4 tablets by mouth Daily.      . metoprolol (LOPRESSOR) 50 MG tablet 1/2 tab po bid      . nitroGLYCERIN (NITROSTAT) 0.4 MG SL tablet Place 0.4 mg under the tongue every 5 (five) minutes as needed.        . warfarin (COUMADIN) 5 MG tablet Take 5 mg by mouth as directed.           Past Medical History  Diagnosis Date  . RHEUMATIC HEART DISEASE   . HYPERTENSION   . MITRAL VALVE REPLACEMENT, HX OF   . DEPRESSION   . AORTIC VALVE REPLACEMENT, HX OF     Past Surgical History  Procedure Date  . Aortic valve replacement   . Mitral valve replacement 1990   . Lumbar disc surgery   . Arthroscopic  knee  surgery   . Cholecystectomy   . Dilation and curettage of uterus   . Tubal ligation   . Right knee surgery in 1993.     History   Social History  . Marital Status: Widowed    Spouse Name: N/A    Number of Children: N/A  . Years of Education: N/A   Occupational History  . Not on file.   Social History Main Topics  . Smoking status: Current Everyday Smoker  . Smokeless tobacco: Not on file  . Alcohol Use: Not  on file  . Drug Use: Not on file  . Sexually Active: Not on file   Other Topics Concern  . Not on file   Social History Narrative  . No narrative on file    ROS: no fevers or chills, productive cough, hemoptysis, dysphasia, odynophagia, melena, hematochezia, dysuria, hematuria, rash, seizure activity, orthopnea, PND, pedal edema, claudication. Remaining systems are negative.  Physical Exam: Well-developed well-nourished in no acute distress.  Skin is warm and dry.  HEENT is normal.  Neck is supple. No thyromegaly.  Chest is clear to auscultation with normal expansion.  Cardiovascular exam is regular rate and rhythm. Crisp mechanical valve sound; 1/6 systolic murmur Abdominal exam nontender or distended. No masses palpated. Extremities show no edema. neuro dysarthria from previous stroke  ECG sinus rhythm with PACs, RV conduction delay.

## 2011-08-20 NOTE — Assessment & Plan Note (Signed)
Continue Coumadin. Monitored by primary care. Check CBC. Continue SBE prophylaxis. Repeat echocardiogram when she returns in one year.

## 2011-08-20 NOTE — Assessment & Plan Note (Signed)
Patient counseled on discontinuing. 

## 2011-08-20 NOTE — Assessment & Plan Note (Signed)
Blood pressure controlled. Check potassium and renal function. 

## 2011-08-21 LAB — BASIC METABOLIC PANEL
BUN: 13 mg/dL (ref 6–23)
Chloride: 105 mEq/L (ref 96–112)
GFR: 26.44 mL/min — ABNORMAL LOW (ref >60.00)
Glucose, Bld: 87 mg/dL (ref 70–99)
Potassium: 4.5 mEq/L (ref 3.5–5.1)

## 2011-08-21 LAB — CBC WITH DIFFERENTIAL/PLATELET
Basophils Absolute: 0.3 10*3/uL — ABNORMAL HIGH (ref 0.0–0.1)
Eosinophils Absolute: 0.1 10*3/uL (ref 0.0–0.7)
HCT: 39 % (ref 36.0–46.0)
Lymphs Abs: 1 10*3/uL (ref 0.7–4.0)
MCV: 95.3 fl (ref 78.0–100.0)
Monocytes Absolute: 0.3 10*3/uL (ref 0.1–1.0)
Platelets: 149 10*3/uL — ABNORMAL LOW (ref 150.0–400.0)
RDW: 15.4 % — ABNORMAL HIGH (ref 11.5–14.6)

## 2011-08-25 NOTE — Progress Notes (Signed)
Addended by: Kem Parkinson on: 08/25/2011 04:50 PM   Modules accepted: Orders

## 2011-10-29 ENCOUNTER — Other Ambulatory Visit (HOSPITAL_COMMUNITY): Payer: Self-pay | Admitting: Internal Medicine

## 2011-10-29 DIAGNOSIS — Z1231 Encounter for screening mammogram for malignant neoplasm of breast: Secondary | ICD-10-CM

## 2011-11-25 ENCOUNTER — Ambulatory Visit (HOSPITAL_COMMUNITY): Payer: Medicare Other

## 2012-01-29 DIAGNOSIS — R209 Unspecified disturbances of skin sensation: Secondary | ICD-10-CM | POA: Insufficient documentation

## 2012-01-29 DIAGNOSIS — R42 Dizziness and giddiness: Secondary | ICD-10-CM | POA: Insufficient documentation

## 2012-01-29 DIAGNOSIS — I679 Cerebrovascular disease, unspecified: Secondary | ICD-10-CM | POA: Insufficient documentation

## 2012-01-29 DIAGNOSIS — R4701 Aphasia: Secondary | ICD-10-CM | POA: Insufficient documentation

## 2012-01-29 DIAGNOSIS — R519 Headache, unspecified: Secondary | ICD-10-CM | POA: Insufficient documentation

## 2012-01-29 DIAGNOSIS — R51 Headache: Secondary | ICD-10-CM | POA: Insufficient documentation

## 2012-01-29 DIAGNOSIS — M47817 Spondylosis without myelopathy or radiculopathy, lumbosacral region: Secondary | ICD-10-CM | POA: Insufficient documentation

## 2012-02-03 ENCOUNTER — Other Ambulatory Visit: Payer: Self-pay | Admitting: Neurology

## 2012-02-03 DIAGNOSIS — R51 Headache: Secondary | ICD-10-CM

## 2012-02-03 DIAGNOSIS — R209 Unspecified disturbances of skin sensation: Secondary | ICD-10-CM

## 2012-02-03 DIAGNOSIS — I679 Cerebrovascular disease, unspecified: Secondary | ICD-10-CM

## 2012-02-03 DIAGNOSIS — R42 Dizziness and giddiness: Secondary | ICD-10-CM

## 2012-02-03 DIAGNOSIS — R4701 Aphasia: Secondary | ICD-10-CM

## 2012-02-03 DIAGNOSIS — M47817 Spondylosis without myelopathy or radiculopathy, lumbosacral region: Secondary | ICD-10-CM

## 2012-02-15 ENCOUNTER — Ambulatory Visit
Admission: RE | Admit: 2012-02-15 | Discharge: 2012-02-15 | Disposition: A | Discharge status: Home / Self Care | Payer: Medicare Other | Source: Ambulatory Visit | Attending: Neurology | Admitting: Neurology

## 2012-02-15 DIAGNOSIS — I679 Cerebrovascular disease, unspecified: Secondary | ICD-10-CM

## 2012-02-15 DIAGNOSIS — R42 Dizziness and giddiness: Secondary | ICD-10-CM

## 2012-02-15 DIAGNOSIS — R51 Headache: Secondary | ICD-10-CM

## 2012-02-15 DIAGNOSIS — R4701 Aphasia: Secondary | ICD-10-CM

## 2012-02-15 DIAGNOSIS — R209 Unspecified disturbances of skin sensation: Secondary | ICD-10-CM

## 2012-02-15 DIAGNOSIS — M47817 Spondylosis without myelopathy or radiculopathy, lumbosacral region: Secondary | ICD-10-CM

## 2012-09-16 ENCOUNTER — Ambulatory Visit: Payer: Medicare Other | Admitting: Cardiology

## 2012-09-19 ENCOUNTER — Ambulatory Visit (INDEPENDENT_AMBULATORY_CARE_PROVIDER_SITE_OTHER): Payer: Medicare Other | Admitting: Cardiology

## 2012-09-19 ENCOUNTER — Encounter: Payer: Self-pay | Admitting: Cardiology

## 2012-09-19 VITALS — BP 105/60 | HR 78 | Ht 68.0 in | Wt 129.0 lb

## 2012-09-19 DIAGNOSIS — Z954 Presence of other heart-valve replacement: Secondary | ICD-10-CM

## 2012-09-19 DIAGNOSIS — Z952 Presence of prosthetic heart valve: Secondary | ICD-10-CM

## 2012-09-19 DIAGNOSIS — I10 Essential (primary) hypertension: Secondary | ICD-10-CM

## 2012-09-19 DIAGNOSIS — Z9889 Other specified postprocedural states: Secondary | ICD-10-CM

## 2012-09-19 NOTE — Assessment & Plan Note (Signed)
Plan repeat echocardiogram.continue SBE prophylaxis. Continue Coumadin. INR monitored by primary care.

## 2012-09-19 NOTE — Progress Notes (Signed)
   HPI: Pleasant female for fu of aortic and mitral valvular disease. Patient status post aortic valve replacement and mitral valve replacement in 1990. Last echocardiogram in Oct 2011 showed normal LV function, prosthetic aortic and mitral valves. Myoview in Oct 2011 showed an ejection fraction of 69% and no ischemia. I last saw her in Dec 2012. Since then, the patient has dyspnea with more extreme activities but not with routine activities. It is relieved with rest. It is not associated with chest pain. There is no orthopnea, PND or pedal edema. There is no syncope or palpitations. There is no exertional chest pain. Occasional dizziness with standing.   Current Outpatient Prescriptions  Medication Sig Dispense Refill  . amitriptyline (ELAVIL) 25 MG tablet 3 tabs po qd       . aspirin 81 MG tablet Take 81 mg by mouth daily.        Marland Kitchen gabapentin (NEURONTIN) 800 MG tablet Take 4 tablets by mouth Daily.      . metoprolol (LOPRESSOR) 50 MG tablet 1/2 tab po bid      . nitroGLYCERIN (NITROSTAT) 0.4 MG SL tablet Place 0.4 mg under the tongue every 5 (five) minutes as needed.        . warfarin (COUMADIN) 5 MG tablet Take 5 mg by mouth as directed.           Past Medical History  Diagnosis Date  . RHEUMATIC HEART DISEASE   . HYPERTENSION   . MITRAL VALVE REPLACEMENT, HX OF   . DEPRESSION   . AORTIC VALVE REPLACEMENT, HX OF     Past Surgical History  Procedure Date  . Aortic valve replacement   . Mitral valve replacement 1990   . Lumbar disc surgery   . Arthroscopic  knee  surgery   . Cholecystectomy   . Dilation and curettage of uterus   . Tubal ligation   . Right knee surgery in 1993.     History   Social History  . Marital Status: Widowed    Spouse Name: N/A    Number of Children: N/A  . Years of Education: N/A   Occupational History  . Not on file.   Social History Main Topics  . Smoking status: Current Every Day Smoker  . Smokeless tobacco: Not on file  . Alcohol Use:  Not on file  . Drug Use: Not on file  . Sexually Active: Not on file   Other Topics Concern  . Not on file   Social History Narrative  . No narrative on file    ROS: no fevers or chills, productive cough, hemoptysis, dysphasia, odynophagia, melena, hematochezia, dysuria, hematuria, rash, seizure activity, orthopnea, PND, pedal edema, claudication. Remaining systems are negative.  Physical Exam: Well-developed well-nourished in no acute distress.  Skin is warm and dry.  HEENT is normal.  Neck is supple.  Chest is clear to auscultation with normal expansion.  Cardiovascular exam is regular rate and rhythm. Crisp mechanical valve sounds, 2/6 systolic murmur left sternal border no diastolic murmur Abdominal exam nontender or distended. No masses palpated. Extremities show no edema. neuro grossly intact  ECG sinus rhythm at a rate of 78. Incomplete right bundle branch block. No ST changes.

## 2012-09-19 NOTE — Assessment & Plan Note (Signed)
Blood pressure controlled. 

## 2012-09-19 NOTE — Assessment & Plan Note (Signed)
Plan repeat echocardiogram.continue SBE prophylaxis. Continue Coumadin. INR monitored by primary care. 

## 2012-09-19 NOTE — Patient Instructions (Addendum)
The current medical regimen is effective;  continue present plan and medications.  Your physician has requested that you have an echocardiogram. Echocardiography is a painless test that uses sound waves to create images of your heart. It provides your doctor with information about the size and shape of your heart and how well your heart's chambers and valves are working. This procedure takes approximately one hour. There are no restrictions for this procedure.  Follow up in 1 year with Dr Jens Som.  You will receive a letter in the mail 2 months before you are due.  Please call us when you receive this letter to schedule your follow up appointment.

## 2012-09-30 ENCOUNTER — Other Ambulatory Visit (HOSPITAL_COMMUNITY): Payer: Medicare Other

## 2012-12-02 ENCOUNTER — Ambulatory Visit: Payer: Self-pay | Admitting: Neurology

## 2012-12-14 ENCOUNTER — Other Ambulatory Visit: Payer: Self-pay | Admitting: Neurology

## 2013-01-18 ENCOUNTER — Other Ambulatory Visit: Payer: Self-pay | Admitting: Neurology

## 2013-02-21 ENCOUNTER — Encounter: Payer: Self-pay | Admitting: Neurology

## 2013-02-21 DIAGNOSIS — R51 Headache: Secondary | ICD-10-CM

## 2013-02-21 DIAGNOSIS — I679 Cerebrovascular disease, unspecified: Secondary | ICD-10-CM

## 2013-02-21 DIAGNOSIS — M47817 Spondylosis without myelopathy or radiculopathy, lumbosacral region: Secondary | ICD-10-CM

## 2013-02-21 DIAGNOSIS — R42 Dizziness and giddiness: Secondary | ICD-10-CM

## 2013-02-21 DIAGNOSIS — R4701 Aphasia: Secondary | ICD-10-CM

## 2013-02-21 DIAGNOSIS — R209 Unspecified disturbances of skin sensation: Secondary | ICD-10-CM

## 2013-02-22 ENCOUNTER — Ambulatory Visit (INDEPENDENT_AMBULATORY_CARE_PROVIDER_SITE_OTHER): Payer: Medicare Other | Admitting: Neurology

## 2013-02-22 ENCOUNTER — Encounter: Payer: Self-pay | Admitting: Neurology

## 2013-02-22 VITALS — BP 118/67 | HR 78 | Ht 65.0 in | Wt 122.0 lb

## 2013-02-22 DIAGNOSIS — I951 Orthostatic hypotension: Secondary | ICD-10-CM

## 2013-02-22 DIAGNOSIS — R4701 Aphasia: Secondary | ICD-10-CM

## 2013-02-22 DIAGNOSIS — I679 Cerebrovascular disease, unspecified: Secondary | ICD-10-CM

## 2013-02-22 DIAGNOSIS — G2581 Restless legs syndrome: Secondary | ICD-10-CM

## 2013-02-22 HISTORY — DX: Restless legs syndrome: G25.81

## 2013-02-22 HISTORY — DX: Orthostatic hypotension: I95.1

## 2013-02-22 MED ORDER — MIDODRINE HCL 2.5 MG PO TABS: ORAL_TABLET | ORAL | Status: DC

## 2013-02-22 MED ORDER — PRAMIPEXOLE DIHYDROCHLORIDE 0.25 MG PO TABS: 0.2500 mg | ORAL_TABLET | Freq: Every day | ORAL | Status: DC

## 2013-02-22 NOTE — Progress Notes (Signed)
Reason for visit: Dizziness  Vanessa Bartlett is an 72 y.o. 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 The patient comes in today indicating that over the last 2 years, she has had some issues with dizziness with standing. The dizziness disappears very rapidly after sitting or lying down. When she stands up, the patient experiences a lightheaded sensation, not true vertigo. The patient has fallen on occasion. The patient has not experienced any true blackout episodes. The patient also reports some problems with restless legs at nighttime, and she finds it difficult to sleep because of this. The patient is on amitriptyline taking 75 mg at night, but she does not wish to stop this medication. The patient returns for an evaluation.   Past Medical History  Diagnosis Date  . RHEUMATIC HEART DISEASE   . HYPERTENSION   . MITRAL VALVE REPLACEMENT, HX OF   . DEPRESSION   . AORTIC VALVE REPLACEMENT, HX OF   . Headache   . Hx of vertigo   . Chronic low back pain   . GERD (gastroesophageal reflux disease)   . Osteoporosis   . Dyslipidemia   . Cerebrovascular disease     with left brain stroke, residual aphasia  . Orthostatic hypotension 02/22/2013  . Restless legs syndrome (RLS) 02/22/2013    Past Surgical History  Procedure Laterality Date  . Aortic valve replacement    . Mitral valve replacement 1990    . Lumbar disc surgery    . Arthroscopic  knee  surgery    . Cholecystectomy    . Dilation and curettage of uterus    . Tubal ligation    . Right knee surgery in 1993.    . Abdominal hysterectomy    . Cataract extraction Bilateral   . Tonsillectomy    . Appendectomy      Family History  Problem Relation Age of Onset  . Cancer    . Heart attack    . Diabetes    . Cancer Mother     esophageal cancer  . Cancer Father     Throat cancer  . Diabetes Sister   . Heart  disease Brother   . Aneurysm Brother     Abdominal aortic aneurysm  . Cancer Sister     Breast cancer    Social history:  reports that she has been smoking.  She does not have any smokeless tobacco history on file. She reports that she does not drink alcohol or use illicit drugs.  Allergies:  Allergies  Allergen Reactions  . Codeine     Medications:  Current Outpatient Prescriptions on File Prior to Visit  Medication Sig Dispense Refill  . amitriptyline (ELAVIL) 25 MG tablet TAKE 3 TABLETS BY MOUTH AT BEDTIME  270 tablet  0  . aspirin 81 MG tablet Take 81 mg by mouth daily.        Marland Kitchen gabapentin (NEURONTIN) 800 MG tablet TAKE 1 TABLET BY MOUTH 4 TIMES A DAY  120 tablet  1  . metoprolol (LOPRESSOR) 50 MG tablet 1/2 tab po bid      . nitroGLYCERIN (NITROSTAT) 0.4 MG SL tablet Place 0.4 mg under the tongue every 5 (five) minutes as needed.        . warfarin (COUMADIN) 5 MG tablet Take 5 mg by mouth as directed.         No current  facility-administered medications on file prior to visit.    ROS:  Out of a complete 14 system review of symptoms, the patient complains only of the following symptoms, and all other reviewed systems are negative.  Weight loss, fatigue  Dizziness Blurred vision Easy bruising, easy bleeding Cramps, achy muscles Runny nose Headache, numbness, weakness, slurred speech, dizziness Insomnia, decreased energy, restless legs  Blood pressure 118/67, pulse 78, height 5\' 5"  (1.651 m), weight 122 lb (55.339 kg).  Blood pressure standing, right arm, is 98/60. Blood pressure sitting, right arm, is 122/70.  Physical Exam  General: The patient is alert and cooperative at the time of the examination.  Skin: No significant peripheral edema is noted.   Neurologic Exam  Cranial nerves: Facial symmetry is present. Speech is a phasic, and slightly dysarthric. Extraocular movements are full. Visual fields are full.  Motor: The patient has good strength in all 4  extremities.  Coordination: The patient has good finger-nose-finger and heel-to-shin bilaterally. The patient has some apraxia with the use of the lower extremities.  Gait and station: The patient has a slightly wide-based, unsteady gait. Tandem gait was not attempted.  Romberg is negative. No drift is seen.  Reflexes: Deep tendon reflexes are symmetric.   Assessment/Plan:   One. Orthostatic hypotension  2. Cerebrovascular disease, residual aphasia  3. Restless leg syndrome  The patient is having problems with orthostatic hypotension, and she has dropped her blood pressure from 122 systolic to 98 systolic from sitting to standing. The patient does not wish to come down off the amitriptyline which may be making her more susceptible to orthostasis. The patient will be placed on low-dose midodrine during the day. The patient is to use a cane for ambulation. The patient is also having significant issues with restless leg syndrome at night, and this is preventing her from sleeping. The Mirapex will be added at nighttime. The patient will followup in 6 months.   Marlan Palau MD 02/22/2013 7:36 PM  [facility] [address] [place], [place] [address]  Phone [phone] Fax [phone]

## 2013-03-13 ENCOUNTER — Other Ambulatory Visit: Payer: Self-pay | Admitting: Neurology

## 2013-04-09 ENCOUNTER — Other Ambulatory Visit: Payer: Self-pay | Admitting: Neurology

## 2013-05-17 ENCOUNTER — Ambulatory Visit (INDEPENDENT_AMBULATORY_CARE_PROVIDER_SITE_OTHER): Payer: Medicare Other | Admitting: Neurology

## 2013-05-17 ENCOUNTER — Encounter: Payer: Self-pay | Admitting: Neurology

## 2013-05-17 VITALS — BP 95/59 | HR 89 | Ht 65.0 in | Wt 121.0 lb

## 2013-05-17 DIAGNOSIS — R269 Unspecified abnormalities of gait and mobility: Secondary | ICD-10-CM

## 2013-05-17 DIAGNOSIS — R42 Dizziness and giddiness: Secondary | ICD-10-CM

## 2013-05-17 DIAGNOSIS — R51 Headache: Secondary | ICD-10-CM

## 2013-05-17 MED ORDER — GABAPENTIN 800 MG PO TABS: 800.0000 mg | ORAL_TABLET | Freq: Three times a day (TID) | ORAL | Status: DC

## 2013-05-17 NOTE — Progress Notes (Signed)
Reason for visit: Gait disorder  Vanessa Bartlett is an 72 y.o. 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 The patient has had greater than one-year history of progressively worsening gait problems and dizziness. The patient reports that she is dizzy with standing, and in general the dizziness will go away with sitting or lying down. The patient recently has had some problems with dizziness even while sitting, and she may get somewhat queasy with the dizziness. The patient denies any true vertigo. The patient reports that the gait instability is worse in a dimly lit or dark room. The patient reports numbness of the right hand and foot following the stroke, but no new numbness of the extremities. The patient does have chronic neck pain and headaches. The patient denies any visual field changes. The patient has a chronic aphasia. Previously, she was noted to have some orthostatic hypotension, and she was placed on low-dose midodrine without benefit. The patient takes Mirapex at night for restless leg syndrome. The patient indicates that she has had a gradual change in her renal function, with the creatinine going up to 2.1. The patient is on high dose gabapentin taking 800 mg 4 times daily. The patient has been on this dose for a number of years, but again her renal function is changing.  Past Medical History  Diagnosis Date  . RHEUMATIC HEART DISEASE   . HYPERTENSION   . MITRAL VALVE REPLACEMENT, HX OF   . DEPRESSION   . AORTIC VALVE REPLACEMENT, HX OF   . Headache   . Hx of vertigo   . Chronic low back pain   . GERD (gastroesophageal reflux disease)   . Osteoporosis   . Dyslipidemia   . Cerebrovascular disease     with left brain stroke, residual aphasia  . Orthostatic hypotension 02/22/2013  . Restless legs syndrome (RLS) 02/22/2013    Past Surgical History   Procedure Laterality Date  . Aortic valve replacement    . Mitral valve replacement 1990    . Lumbar disc surgery    . Arthroscopic  knee  surgery    . Cholecystectomy    . Dilation and curettage of uterus    . Tubal ligation    . Right knee surgery in 1993.    . Abdominal hysterectomy    . Cataract extraction Bilateral   . Tonsillectomy    . Appendectomy      Family History  Problem Relation Age of Onset  . Cancer    . Heart attack    . Diabetes    . Cancer Mother     esophageal cancer  . Cancer Father     Throat cancer  . Diabetes Sister     Kidney Cancer  . Heart disease Brother     Lung Cancer  . Aneurysm Brother     Abdominal aortic aneurysm  . Cancer Brother     lung  . Cancer Sister     Breast cancer    Social history:  reports that she has been smoking.  She has never used smokeless tobacco. She reports that she does not drink alcohol or use illicit drugs.    Allergies  Allergen Reactions  . Codeine     Medications:  Current Outpatient Prescriptions on File Prior to Visit  Medication Sig Dispense Refill  . amitriptyline (ELAVIL) 25 MG tablet TAKE  3 TABLETS BY MOUTH AT BEDTIME  270 tablet  1  . aspirin 81 MG tablet Take 81 mg by mouth daily.        . metoprolol (LOPRESSOR) 50 MG tablet 1/2 tab po bid      . midodrine (PROAMATINE) 2.5 MG tablet One tablet in the morning and one at noon  60 tablet  3  . nitroGLYCERIN (NITROSTAT) 0.4 MG SL tablet Place 0.4 mg under the tongue every 5 (five) minutes as needed.        . pramipexole (MIRAPEX) 0.25 MG tablet Take 1 tablet (0.25 mg total) by mouth at bedtime.  30 tablet  3  . warfarin (COUMADIN) 5 MG tablet Take 5 mg by mouth as directed.         No current facility-administered medications on file prior to visit.    ROS:  Out of a complete 14 system review of symptoms, the patient complains only of the following symptoms, and all other reviewed systems are negative.  Weight loss, fatigue Chest pains,  swelling in the legs Spinning sensations, trouble swallowing Blurred vision Urination problems Easy bruising, easy bleeding Joint pain, achy muscles Runny nose Headache, numbness, weakness, slurred speech, difficulty swallowing, dizziness Restless legs, insomnia  Blood pressure 95/59, pulse 89, height 5\' 5"  (1.651 m), weight 121 lb (54.885 kg).  Physical Exam  General: The patient is alert and cooperative at the time of the examination.  Skin: No significant peripheral edema is noted.   Neurologic Exam  Cranial nerves: Facial symmetry is not present. Slight depression of the right nasolabial fold is seen. Speech is aphasic. Extraocular movements are full. Visual fields are full.  Motor: The patient has good strength in all 4 extremities.  Coordination: The patient has good finger-nose-finger and heel-to-shin bilaterally.  Sensory exam: The patient appears to have decreased pinprick sensation on the right arm and right leg as compared to left. Vibration sensation is decreased on the right arm and right leg, with a decrease in position sense of the right arm, more symmetric with the lower extremities.  Gait and station: The patient has a slightly wide-based, ataxia gait. Tandem gait is unsteady. Romberg is negative. No drift is seen.  Reflexes: Deep tendon reflexes are symmetric.   Assessment/Plan:  1. Cerebrovascular disease, left brain stroke  2. Gait disorder, dizziness  The patient is having increasing problems with dizziness. The patient has not severely orthostatic on this evaluation today. The patient reports worsening of renal function, and her most recent creatinine was 2.1. The patient is on high dose gabapentin, and it is possible that she may have become toxic on this medication as the renal function is changing. The patient will drop back to taking 800 mg 3 times a day of the gabapentin. The patient will undergo a CT of the head and cervical spine, and further blood  work. In the future, nerve conduction studies of the extremities may be done to exclude a neuropathy. The patient will followup in 3-4 months.  Marlan Palau MD 05/17/2013 3:42 PM  [facility] [address] [place], [place] [address]  Phone [phone] Fax [phone]

## 2013-05-18 LAB — TSH: TSH: 2.74 u[IU]/mL (ref 0.450–4.500)

## 2013-05-18 NOTE — Progress Notes (Signed)
Quick Note:  I called and LMVM home that labs normal. She is to call back if questions. ______

## 2013-06-05 ENCOUNTER — Other Ambulatory Visit: Payer: Medicare Other

## 2013-06-08 ENCOUNTER — Telehealth: Payer: Self-pay | Admitting: Neurology

## 2013-06-08 ENCOUNTER — Ambulatory Visit
Admission: RE | Admit: 2013-06-08 | Discharge: 2013-06-08 | Disposition: A | Discharge status: Home / Self Care | Payer: Medicare Other | Source: Ambulatory Visit | Attending: Neurology | Admitting: Neurology

## 2013-06-08 ENCOUNTER — Ambulatory Visit
Admission: RE | Admit: 2013-06-08 | Discharge: 2013-06-08 | Disposition: A | Discharge status: Home / Self Care | Source: Ambulatory Visit | Attending: Neurology | Admitting: Neurology

## 2013-06-08 DIAGNOSIS — R42 Dizziness and giddiness: Secondary | ICD-10-CM

## 2013-06-08 DIAGNOSIS — R51 Headache: Secondary | ICD-10-CM

## 2013-06-08 DIAGNOSIS — R269 Unspecified abnormalities of gait and mobility: Secondary | ICD-10-CM

## 2013-06-08 NOTE — Telephone Encounter (Signed)
I called patient. The patient had a CT scan of brain that showed bihemispheric strokes, unchanged from June of 2013. CT scan of the cervical spine does not show spinal stenosis. The gait disorder and dizziness is likely related to gabapentin associated with worsening renal function. The patient is now on 800 mg 3 times daily, and she will cut back to 800 mg twice daily.

## 2013-06-12 ENCOUNTER — Other Ambulatory Visit: Payer: Self-pay | Admitting: Neurology

## 2013-06-20 ENCOUNTER — Encounter: Payer: Self-pay | Admitting: Neurology

## 2013-08-04 ENCOUNTER — Encounter (HOSPITAL_COMMUNITY): Payer: Self-pay | Admitting: Emergency Medicine

## 2013-08-04 ENCOUNTER — Emergency Department (HOSPITAL_COMMUNITY): Payer: Medicare Other

## 2013-08-04 ENCOUNTER — Inpatient Hospital Stay (HOSPITAL_COMMUNITY)
Admission: EM | Admit: 2013-08-04 | Discharge: 2013-08-07 | DRG: 312 | Disposition: A | Discharge status: Skilled Nursing Facility | Payer: Medicare Other | Attending: Internal Medicine | Admitting: Internal Medicine

## 2013-08-04 DIAGNOSIS — N390 Urinary tract infection, site not specified: Secondary | ICD-10-CM

## 2013-08-04 DIAGNOSIS — Z7901 Long term (current) use of anticoagulants: Secondary | ICD-10-CM

## 2013-08-04 DIAGNOSIS — I099 Rheumatic heart disease, unspecified: Secondary | ICD-10-CM | POA: Diagnosis present

## 2013-08-04 DIAGNOSIS — S61209A Unspecified open wound of unspecified finger without damage to nail, initial encounter: Secondary | ICD-10-CM | POA: Diagnosis present

## 2013-08-04 DIAGNOSIS — I69920 Aphasia following unspecified cerebrovascular disease: Secondary | ICD-10-CM

## 2013-08-04 DIAGNOSIS — R072 Precordial pain: Secondary | ICD-10-CM

## 2013-08-04 DIAGNOSIS — R51 Headache: Secondary | ICD-10-CM

## 2013-08-04 DIAGNOSIS — I951 Orthostatic hypotension: Secondary | ICD-10-CM

## 2013-08-04 DIAGNOSIS — R4701 Aphasia: Secondary | ICD-10-CM | POA: Diagnosis present

## 2013-08-04 DIAGNOSIS — M47817 Spondylosis without myelopathy or radiculopathy, lumbosacral region: Secondary | ICD-10-CM

## 2013-08-04 DIAGNOSIS — Z9889 Other specified postprocedural states: Secondary | ICD-10-CM

## 2013-08-04 DIAGNOSIS — I10 Essential (primary) hypertension: Secondary | ICD-10-CM | POA: Diagnosis present

## 2013-08-04 DIAGNOSIS — I639 Cerebral infarction, unspecified: Secondary | ICD-10-CM | POA: Diagnosis present

## 2013-08-04 DIAGNOSIS — M810 Age-related osteoporosis without current pathological fracture: Secondary | ICD-10-CM | POA: Diagnosis present

## 2013-08-04 DIAGNOSIS — Z9181 History of falling: Secondary | ICD-10-CM

## 2013-08-04 DIAGNOSIS — Z954 Presence of other heart-valve replacement: Secondary | ICD-10-CM

## 2013-08-04 DIAGNOSIS — R42 Dizziness and giddiness: Secondary | ICD-10-CM

## 2013-08-04 DIAGNOSIS — F3289 Other specified depressive episodes: Secondary | ICD-10-CM | POA: Diagnosis present

## 2013-08-04 DIAGNOSIS — R209 Unspecified disturbances of skin sensation: Secondary | ICD-10-CM

## 2013-08-04 DIAGNOSIS — F329 Major depressive disorder, single episode, unspecified: Secondary | ICD-10-CM

## 2013-08-04 DIAGNOSIS — I69959 Hemiplegia and hemiparesis following unspecified cerebrovascular disease affecting unspecified side: Secondary | ICD-10-CM

## 2013-08-04 DIAGNOSIS — I679 Cerebrovascular disease, unspecified: Secondary | ICD-10-CM

## 2013-08-04 DIAGNOSIS — W19XXXA Unspecified fall, initial encounter: Secondary | ICD-10-CM | POA: Diagnosis present

## 2013-08-04 DIAGNOSIS — Z79899 Other long term (current) drug therapy: Secondary | ICD-10-CM

## 2013-08-04 DIAGNOSIS — R471 Dysarthria and anarthria: Secondary | ICD-10-CM | POA: Diagnosis present

## 2013-08-04 DIAGNOSIS — S61219A Laceration without foreign body of unspecified finger without damage to nail, initial encounter: Secondary | ICD-10-CM

## 2013-08-04 DIAGNOSIS — E785 Hyperlipidemia, unspecified: Secondary | ICD-10-CM | POA: Diagnosis present

## 2013-08-04 DIAGNOSIS — K219 Gastro-esophageal reflux disease without esophagitis: Secondary | ICD-10-CM | POA: Diagnosis present

## 2013-08-04 DIAGNOSIS — R1310 Dysphagia, unspecified: Secondary | ICD-10-CM | POA: Diagnosis present

## 2013-08-04 DIAGNOSIS — F0391 Unspecified dementia with behavioral disturbance: Secondary | ICD-10-CM

## 2013-08-04 DIAGNOSIS — A498 Other bacterial infections of unspecified site: Secondary | ICD-10-CM | POA: Diagnosis present

## 2013-08-04 DIAGNOSIS — G2581 Restless legs syndrome: Secondary | ICD-10-CM | POA: Diagnosis present

## 2013-08-04 DIAGNOSIS — F17200 Nicotine dependence, unspecified, uncomplicated: Secondary | ICD-10-CM | POA: Diagnosis present

## 2013-08-04 LAB — URINALYSIS, ROUTINE W REFLEX MICROSCOPIC
Glucose, UA: NEGATIVE mg/dL
Hgb urine dipstick: NEGATIVE
Ketones, ur: 15 mg/dL — AB
Nitrite: NEGATIVE
Protein, ur: NEGATIVE mg/dL
Specific Gravity, Urine: 1.022 (ref 1.005–1.030)
Urobilinogen, UA: 1 mg/dL (ref 0.0–1.0)

## 2013-08-04 LAB — CBC
HCT: 32.4 % — ABNORMAL LOW (ref 36.0–46.0)
Hemoglobin: 10.6 g/dL — ABNORMAL LOW (ref 12.0–15.0)
MCH: 29.5 pg (ref 26.0–34.0)
MCHC: 32.7 g/dL (ref 30.0–36.0)
RBC: 3.59 MIL/uL — ABNORMAL LOW (ref 3.87–5.11)

## 2013-08-04 LAB — URINE MICROSCOPIC-ADD ON

## 2013-08-04 LAB — COMPREHENSIVE METABOLIC PANEL
Albumin: 3.4 g/dL — ABNORMAL LOW (ref 3.5–5.2)
CO2: 23 mEq/L (ref 19–32)
Calcium: 9.4 mg/dL (ref 8.4–10.5)
Creatinine, Ser: 1.68 mg/dL — ABNORMAL HIGH (ref 0.50–1.10)
GFR calc Af Amer: 34 mL/min — ABNORMAL LOW (ref >90)
GFR calc non Af Amer: 30 mL/min — ABNORMAL LOW (ref >90)
Glucose, Bld: 86 mg/dL (ref 70–99)
Potassium: 4.3 mEq/L (ref 3.5–5.1)
Total Protein: 7.5 g/dL (ref 6.0–8.3)

## 2013-08-04 LAB — PROTIME-INR
INR: 3.12 — ABNORMAL HIGH (ref 0.00–1.49)
Prothrombin Time: 31 seconds — ABNORMAL HIGH (ref 11.6–15.2)

## 2013-08-04 MED ORDER — SERTRALINE HCL 100 MG PO TABS
100.0000 mg | ORAL_TABLET | Freq: Every day | ORAL | Status: DC
  Administered 2013-08-05 – 2013-08-07 (×3): 100 mg via ORAL
  Filled 2013-08-04 (×3): qty 1

## 2013-08-04 MED ORDER — METOPROLOL TARTRATE 25 MG PO TABS
25.0000 mg | ORAL_TABLET | Freq: Two times a day (BID) | ORAL | Status: DC
  Administered 2013-08-04: 25 mg via ORAL
  Filled 2013-08-04 (×3): qty 1

## 2013-08-04 MED ORDER — ASPIRIN 81 MG PO TABS: 81.0000 mg | ORAL_TABLET | Freq: Every day | ORAL | Status: DC

## 2013-08-04 MED ORDER — DEXTROSE 5 % IV SOLN
1.0000 g | INTRAVENOUS | Status: DC
  Filled 2013-08-04: qty 10

## 2013-08-04 MED ORDER — GABAPENTIN 800 MG PO TABS
800.0000 mg | ORAL_TABLET | Freq: Two times a day (BID) | ORAL | Status: DC
  Filled 2013-08-04: qty 1

## 2013-08-04 MED ORDER — ATORVASTATIN CALCIUM 10 MG PO TABS
10.0000 mg | ORAL_TABLET | Freq: Every day | ORAL | Status: DC
  Administered 2013-08-05 – 2013-08-07 (×3): 10 mg via ORAL
  Filled 2013-08-04 (×3): qty 1

## 2013-08-04 MED ORDER — PRAMIPEXOLE DIHYDROCHLORIDE 0.25 MG PO TABS
0.2500 mg | ORAL_TABLET | Freq: Every day | ORAL | Status: DC
  Administered 2013-08-04 – 2013-08-06 (×3): 0.25 mg via ORAL
  Filled 2013-08-04 (×4): qty 1

## 2013-08-04 MED ORDER — TRAMADOL-ACETAMINOPHEN 37.5-325 MG PO TABS
1.0000 | ORAL_TABLET | Freq: Three times a day (TID) | ORAL | Status: DC | PRN
  Administered 2013-08-05: 1 via ORAL
  Filled 2013-08-04: qty 1

## 2013-08-04 MED ORDER — AMITRIPTYLINE HCL 75 MG PO TABS
75.0000 mg | ORAL_TABLET | Freq: Every day | ORAL | Status: DC
  Administered 2013-08-04 – 2013-08-06 (×3): 75 mg via ORAL
  Filled 2013-08-04 (×4): qty 1

## 2013-08-04 MED ORDER — WARFARIN SODIUM 2.5 MG PO TABS
2.5000 mg | ORAL_TABLET | Freq: Once | ORAL | Status: AC
  Administered 2013-08-04: 2.5 mg via ORAL
  Filled 2013-08-04: qty 1

## 2013-08-04 MED ORDER — SODIUM CHLORIDE 0.9 % IJ SOLN
3.0000 mL | Freq: Two times a day (BID) | INTRAMUSCULAR | Status: DC
  Administered 2013-08-04 – 2013-08-07 (×4): 3 mL via INTRAVENOUS

## 2013-08-04 MED ORDER — ACETAMINOPHEN 325 MG PO TABS: 650.0000 mg | ORAL_TABLET | Freq: Four times a day (QID) | ORAL | Status: DC | PRN

## 2013-08-04 MED ORDER — NITROGLYCERIN 0.4 MG SL SUBL: 0.4000 mg | SUBLINGUAL_TABLET | SUBLINGUAL | Status: DC | PRN

## 2013-08-04 MED ORDER — ONDANSETRON HCL 4 MG PO TABS: 4.0000 mg | ORAL_TABLET | Freq: Four times a day (QID) | ORAL | Status: DC | PRN

## 2013-08-04 MED ORDER — LIDOCAINE 5 % EX PTCH
1.0000 | MEDICATED_PATCH | CUTANEOUS | Status: DC
  Administered 2013-08-04 – 2013-08-06 (×3): 1 via TRANSDERMAL
  Filled 2013-08-04 (×4): qty 1

## 2013-08-04 MED ORDER — WARFARIN - PHARMACIST DOSING INPATIENT
Freq: Every day | Status: DC
  Administered 2013-08-05 – 2013-08-06 (×2)

## 2013-08-04 MED ORDER — DEXTROSE 5 % IV SOLN
1.0000 g | Freq: Once | INTRAVENOUS | Status: AC
  Administered 2013-08-04: 1 g via INTRAVENOUS
  Filled 2013-08-04: qty 10

## 2013-08-04 MED ORDER — DEXTROSE 5 % IV SOLN
1.0000 g | INTRAVENOUS | Status: DC
  Administered 2013-08-05 – 2013-08-06 (×2): 1 g via INTRAVENOUS
  Filled 2013-08-04 (×4): qty 10

## 2013-08-04 MED ORDER — SODIUM CHLORIDE 0.9 % IV SOLN
INTRAVENOUS | Status: DC
  Administered 2013-08-04: 22:00:00 via INTRAVENOUS

## 2013-08-04 MED ORDER — ASPIRIN EC 81 MG PO TBEC
81.0000 mg | DELAYED_RELEASE_TABLET | Freq: Every day | ORAL | Status: DC
  Administered 2013-08-05 – 2013-08-07 (×3): 81 mg via ORAL
  Filled 2013-08-04 (×3): qty 1

## 2013-08-04 MED ORDER — ONDANSETRON HCL 4 MG/2ML IJ SOLN: 4.0000 mg | Freq: Four times a day (QID) | INTRAMUSCULAR | Status: DC | PRN

## 2013-08-04 MED ORDER — GABAPENTIN 400 MG PO CAPS
800.0000 mg | ORAL_CAPSULE | Freq: Two times a day (BID) | ORAL | Status: DC
  Administered 2013-08-04 – 2013-08-07 (×6): 800 mg via ORAL
  Filled 2013-08-04 (×7): qty 2

## 2013-08-04 NOTE — ED Notes (Signed)
Quick clot applied to rt index finger per dr sheldon's order. minimal bleeding noted. Finger wrapped with gauze.

## 2013-08-04 NOTE — Progress Notes (Signed)
Patient was admitted to unit from the ED via stretcher. Pt has son at bedside. Patient is alert and oriented but does have some expressive aphasia due to previous strokes. Patient also has some broken teeth majority located in the bottom of her mouth. Patient is orient to room, call bell is within reach. Pt is placed on box number 09. Call bell is within reach. Will continue to monitor.   Kajuana Shareef J. Lendell Caprice RN

## 2013-08-04 NOTE — Progress Notes (Signed)
ANTICOAGULATION CONSULT NOTE - Initial Consult  Pharmacy Consult for Coumadin Indication: History of MV, AVR  Allergies  Allergen Reactions  . Codeine    Vital Signs: Temp: 97.6 F (36.4 C) (11/21 1324) BP: 114/69 mmHg (11/21 2100) Pulse Rate: 86 (11/21 2100)  Labs:  Recent Labs  08/04/13 1629  HGB 10.6*  HCT 32.4*  PLT 185  LABPROT 31.0*  INR 3.12*  CREATININE 1.68*    The CrCl is unknown because both a height and weight (above a minimum accepted value) are required for this calculation.   Medical History: Past Medical History  Diagnosis Date  . RHEUMATIC HEART DISEASE   . HYPERTENSION   . MITRAL VALVE REPLACEMENT, HX OF   . DEPRESSION   . AORTIC VALVE REPLACEMENT, HX OF   . Headache   . Hx of vertigo   . Chronic low back pain   . GERD (gastroesophageal reflux disease)   . Osteoporosis   . Dyslipidemia   . Cerebrovascular disease     with left brain stroke, residual aphasia  . Orthostatic hypotension 02/22/2013  . Restless legs syndrome (RLS) 02/22/2013    Assessment: 72 year old female on Coumadin PTA for AVR/MVR admitted with frequent falls, finger laceration and UTI (started on Rocephin).  INR on admission is therapeutic at 3.12.  Dose PTA = 5 mg daily except for 2.5 mg MWF (no dose taken today)  Goal of Therapy:  INR goal = 2.5 to 3.5 Monitor platelets by anticoagulation protocol: Yes   Plan:  1) Coumadin 2.5 mg po x 1 dose tonight 2) Daily INR  Thank you. Okey Regal, PharmD [phone]  08/04/2013,9:38 PM

## 2013-08-04 NOTE — ED Notes (Signed)
Per pt and family. sts cut finger last night on a plate and has been bleeding since about 10 pm last night. sts also pt has been falling a lot lately. sts bruises all over body due to falls

## 2013-08-04 NOTE — ED Notes (Signed)
Patient transported to CT 

## 2013-08-04 NOTE — H&P (Signed)
[facility] History and Physical  Patient: Vanessa Bartlett  WJX:[id number]  DOB: April 04, 1941  DOS: the patient was seen and examined on 08/04/2013 PCP: Londell Moh, MD  Chief Complaint: Dizziness and fall   HPI: Vanessa Bartlett is a 72 y.o. female with Past medical history of orthostatic hypotension, history of CVA with residue expressive aphasia and right-sided weakness, hypertension, dyslipidemia, history of mitral and aortic wall replacement on Coumadin. The patient is coming from home. The patient is presenting with injury of her right index finger and recurrent fall and dizziness. She mentions that yesterday while doing her household chores she cut her right index finger and had ongoing on and off bleeding throughout the night. She denies any bleeding anywhere else. Along with that she also mentioned that she has been having recurrent dizziness. She mentions that she has diagnosis of orthostatic hypotension and has been following with a neurologist but her symptoms are progressively worsening over last few weeks with frequent falls. Her last one was yesterday. She was at home without any support and uses furniture and walls to help her support. She lives at home alone and does her medication on her own. Along with that she mentions that she has recurrent nausea especially when she is walking around and do to which she has limited oral intake.  Pt denies any fever, chills, headache, cough, chest pain, palpitation, shortness of breath, orthopnea, PND, abdominal pain, diarrhea, constipation, burning urination, dizziness, pedal edema,  focal neurological deficit.   Review of Systems: as mentioned in the history of present illness.  A Comprehensive review of the other systems is negative.  Past Medical History  Diagnosis Date  . RHEUMATIC HEART DISEASE   . HYPERTENSION   . MITRAL VALVE REPLACEMENT, HX OF   . DEPRESSION   . AORTIC VALVE REPLACEMENT, HX OF   . Headache   . Hx of  vertigo   . Chronic low back pain   . GERD (gastroesophageal reflux disease)   . Osteoporosis   . Dyslipidemia   . Cerebrovascular disease     with left brain stroke, residual aphasia  . Orthostatic hypotension 02/22/2013  . Restless legs syndrome (RLS) 02/22/2013   Past Surgical History  Procedure Laterality Date  . Aortic valve replacement    . Mitral valve replacement 1990    . Lumbar disc surgery    . Arthroscopic  knee  surgery    . Cholecystectomy    . Dilation and curettage of uterus    . Tubal ligation    . Right knee surgery in 1993.    . Abdominal hysterectomy    . Cataract extraction Bilateral   . Tonsillectomy    . Appendectomy     Social History:  reports that she has been smoking.  She has never used smokeless tobacco. She reports that she does not drink alcohol or use illicit drugs. Independent for most of her  ADL.  Allergies  Allergen Reactions  . Codeine     Family History  Problem Relation Age of Onset  . Cancer    . Heart attack    . Diabetes    . Cancer Mother     esophageal cancer  . Cancer Father     Throat cancer  . Diabetes Sister     Kidney Cancer  . Heart disease Brother     Lung Cancer  . Aneurysm Brother     Abdominal aortic aneurysm  . Cancer Brother     lung  .  Cancer Sister     Breast cancer    Prior to Admission medications   Medication Sig Start Date End Date Taking? Authorizing Provider  amitriptyline (ELAVIL) 25 MG tablet Take 75 mg by mouth at bedtime.   Yes Historical Provider, MD  aspirin 81 MG tablet Take 81 mg by mouth daily.     Yes Historical Provider, MD  atorvastatin (LIPITOR) 10 MG tablet Take 10 mg by mouth daily. 05/16/13  Yes Historical Provider, MD  Calcium Carbonate-Vitamin D (CALCIUM 500 + D PO) Take 500 mg by mouth 2 (two) times daily.   Yes Historical Provider, MD  gabapentin (NEURONTIN) 800 MG tablet Take 800 mg by mouth 2 (two) times daily.   Yes Historical Provider, MD  metoprolol (LOPRESSOR) 50 MG  tablet Take 25 mg by mouth 2 (two) times daily. 1/2 tab po bid 08/18/11  Yes Historical Provider, MD  nitroGLYCERIN (NITROSTAT) 0.4 MG SL tablet Place 0.4 mg under the tongue every 5 (five) minutes as needed for chest pain.    Yes Historical Provider, MD  pramipexole (MIRAPEX) 0.25 MG tablet Take 0.25 mg by mouth at bedtime.   Yes Historical Provider, MD  sertraline (ZOLOFT) 100 MG tablet Take 100 mg by mouth daily. 04/24/13  Yes Historical Provider, MD  warfarin (COUMADIN) 5 MG tablet Take 5 mg by mouth daily at 6 PM. Take 1 tablet daily except take a 0.5 tablet on Mon, Wed, Fri   Yes Historical Provider, MD    Physical Exam: Filed Vitals:   08/04/13 1816 08/04/13 1900 08/04/13 2000 08/04/13 2100  BP: 116/96 154/131 133/75 114/69  Pulse: 87 92 90 86  Temp:      Resp: 18     SpO2: 95% 97% 96% 95%    General: Alert, Awake and Oriented to Time, Place and Person. Appear in mild distress and anxiety Eyes: PERRL ENT: Oral Mucosa clear moist. Neck: no JVD Cardiovascular: S1 and S2 Present, aortic systolic Murmur, Peripheral Pulses Present Respiratory: Bilateral Air entry equal and Decreased, Clear to Auscultation,  no Crackles,no wheezes Abdomen: Bowel Sound Present, Soft and Non tender Skin: no Rash Extremities: no Pedal edema, no calf tenderness Neurologic: Mental status alert and oriented, expressive aphasia which is chronic, good cough reflex, Cranial Nerves pupils are reactive, Motor strength bilaterally equal strength, Sensation is intact sensation to light touch, reflexes intact, babinski negative, Cerebellar test difficult to perform.  Labs on Admission:  CBC:  Recent Labs Lab 08/04/13 1629  WBC 7.0  HGB 10.6*  HCT 32.4*  MCV 90.3  PLT 185    CMP     Component Value Date/Time   NA 138 08/04/2013 1629   K 4.3 08/04/2013 1629   CL 103 08/04/2013 1629   CO2 23 08/04/2013 1629   GLUCOSE 86 08/04/2013 1629   BUN 17 08/04/2013 1629   CREATININE 1.68* 08/04/2013 1629    CALCIUM 9.4 08/04/2013 1629   PROT 7.5 08/04/2013 1629   ALBUMIN 3.4* 08/04/2013 1629   AST 35 08/04/2013 1629   ALT 10 08/04/2013 1629   ALKPHOS 151* 08/04/2013 1629   BILITOT 0.4 08/04/2013 1629   GFRNONAA 30* 08/04/2013 1629   GFRAA 34* 08/04/2013 1629    No results found for this basename: LIPASE, AMYLASE,  in the last 168 hours No results found for this basename: AMMONIA,  in the last 168 hours  No results found for this basename: CKTOTAL, CKMB, CKMBINDEX, TROPONINI,  in the last 168 hours BNP (last 3 results) No results  found for this basename: PROBNP,  in the last 8760 hours  Radiological Exams on Admission: Ct Head Wo Contrast  08/04/2013   CLINICAL DATA:  Patient on Coumadin with multiple recent falls  EXAM: CT HEAD WITHOUT CONTRAST  TECHNIQUE: Contiguous axial images were obtained from the base of the skull through the vertex without intravenous contrast.  COMPARISON:  06/08/2013, 02/15/2012  FINDINGS: No hemorrhage or extra-axial fluid. No skull fracture. Encephalomalacia left parietal lobe stable. Encephalomalacia right temporal lobe stable. Mild diffuse atrophy unchanged. No evidence of mass, acute infarct, or hydrocephalus.  IMPRESSION: No acute findings.   Electronically Signed   By: Esperanza Heir M.D.   On: 08/04/2013 17:13    EKG: Independently reviewed. nonspecific ST and T waves changes.  Assessment/Plan Principal Problem:   Fall Active Problems:   HYPERTENSION   MITRAL VALVE REPLACEMENT, HX OF   AORTIC VALVE REPLACEMENT, HX OF   Aphasia   Orthostatic hypotension   CVA (cerebral infarction)   1. Fall The patient is presenting with recurrent fall with likely etiology of orthostatic hypotension. She is at home alone, although her son lives 15 minutes away and checks on her. She denies any symptoms of infection or cardiac etiology or neurological disorder. Her CT scan is negative for any acute abnormality. At present I recommended the patient for physical  therapy evaluation for safety and balance as well as occupational therapy evaluation. I also recommended the patient that she may have to go for a short-term rehabilitation facility for improvement of her balance. The patient refuses to go to rehabilitation but is agreeable to work with the physical therapy. She will be admitted for observation monitored on telemetry. She also possibly have a UTI for which I would continue her on ceftriaxone. I will gently hydrate her as well.  2. Mechanical valve. Due to recurrent fall she is high risk for being on chronic anticoagulation but she has mechanical valve. At present I would continue Coumadin  3.Continue metoprolol for hypertension   4.Restless leg syndrome Continue Mirapex  5. finger injury The wound has been dressed, and consult skin care  6. history of aspiration I will request speech evaluation before progressing her diet.  DVT Prophylaxis: On Coumadin mechanical compression device Nutrition: Advance as tolerated pending speech evaluation  Code Status: Full  Family Communication: Son  was present at bedside, opportunity was given to ask question and all questions were answered satisfactorily at the time of interview. Disposition: Admitted to observation in telemetry unit.  Author: Lynden Oxford, MD [facility] Pager: [phone] 08/04/2013, 9:28 PM    If 7PM-7AM, please contact night-coverage [email or web address] Password [email or web address]

## 2013-08-04 NOTE — ED Notes (Signed)
Dr. Patel in room.

## 2013-08-04 NOTE — ED Provider Notes (Signed)
CSN: [id number]     Arrival date & time 08/04/13  1303 History   First MD Initiated Contact with Patient 08/04/13 1559     Chief Complaint  Patient presents with  . Fall  . Extremity Laceration   (Consider location/radiation/quality/duration/timing/severity/associated sxs/prior Treatment) Patient is a 72 y.o. female presenting with fall.  Fall   Pt with history of stroke and valve replacement on coumadin reports she cut her right index finger on a plate last night around 10pm and had difficulty controlling bleeding throughout the night. Bleeding on arrival but had dressing placed in triage and improved.  She also reports she has been falling frequently over the last several weeks. Feels dizzy at times, and has to hold on to furniture. She last fell a few days ago. Has been seen by PCP and Neurology for same, has had medication adjustments without improvement.   Past Medical History  Diagnosis Date  . RHEUMATIC HEART DISEASE   . HYPERTENSION   . MITRAL VALVE REPLACEMENT, HX OF   . DEPRESSION   . AORTIC VALVE REPLACEMENT, HX OF   . Headache   . Hx of vertigo   . Chronic low back pain   . GERD (gastroesophageal reflux disease)   . Osteoporosis   . Dyslipidemia   . Cerebrovascular disease     with left brain stroke, residual aphasia  . Orthostatic hypotension 02/22/2013  . Restless legs syndrome (RLS) 02/22/2013   Past Surgical History  Procedure Laterality Date  . Aortic valve replacement    . Mitral valve replacement 1990    . Lumbar disc surgery    . Arthroscopic  knee  surgery    . Cholecystectomy    . Dilation and curettage of uterus    . Tubal ligation    . Right knee surgery in 1993.    . Abdominal hysterectomy    . Cataract extraction Bilateral   . Tonsillectomy    . Appendectomy     Family History  Problem Relation Age of Onset  . Cancer    . Heart attack    . Diabetes    . Cancer Mother     esophageal cancer  . Cancer Father     Throat cancer  . Diabetes  Sister     Kidney Cancer  . Heart disease Brother     Lung Cancer  . Aneurysm Brother     Abdominal aortic aneurysm  . Cancer Brother     lung  . Cancer Sister     Breast cancer   History  Substance Use Topics  . Smoking status: Current Every Day Smoker -- 0.30 packs/day  . Smokeless tobacco: Never Used  . Alcohol Use: No   OB History   Grav Para Term Preterm Abortions TAB SAB Ect Mult Living                 Review of Systems All other systems reviewed and are negative except as noted in HPI.   Allergies  Codeine  Home Medications   Current Outpatient Rx  Name  Route  Sig  Dispense  Refill  . amitriptyline (ELAVIL) 25 MG tablet   Oral   Take 75 mg by mouth at bedtime.         Marland Kitchen aspirin 81 MG tablet   Oral   Take 81 mg by mouth daily.           Marland Kitchen atorvastatin (LIPITOR) 10 MG tablet   Oral   Take 10  mg by mouth daily.         . Calcium Carbonate-Vitamin D (CALCIUM 500 + D PO)   Oral   Take 500 mg by mouth 2 (two) times daily.         Marland Kitchen gabapentin (NEURONTIN) 800 MG tablet   Oral   Take 800 mg by mouth 2 (two) times daily.         . metoprolol (LOPRESSOR) 50 MG tablet   Oral   Take 25 mg by mouth 2 (two) times daily. 1/2 tab po bid         . nitroGLYCERIN (NITROSTAT) 0.4 MG SL tablet   Sublingual   Place 0.4 mg under the tongue every 5 (five) minutes as needed for chest pain.          . pramipexole (MIRAPEX) 0.25 MG tablet   Oral   Take 0.25 mg by mouth at bedtime.         . sertraline (ZOLOFT) 100 MG tablet   Oral   Take 100 mg by mouth daily.         Marland Kitchen warfarin (COUMADIN) 5 MG tablet   Oral   Take 5 mg by mouth daily at 6 PM. Take 1 tablet daily except take a 0.5 tablet on Mon, Wed, Fri          BP 145/82  Pulse 86  Temp(Src) 97.6 F (36.4 C)  Resp 18  SpO2 97% Physical Exam  Nursing note and vitals reviewed. Constitutional: She is oriented to person, place, and time. She appears well-developed and well-nourished.   HENT:  Head: Normocephalic and atraumatic.  Eyes: EOM are normal. Pupils are equal, round, and reactive to light.  Neck: Normal range of motion. Neck supple.  Cardiovascular: Normal rate, normal heart sounds and intact distal pulses.   Pulmonary/Chest: Effort normal and breath sounds normal.  Abdominal: Bowel sounds are normal. She exhibits no distension. There is no tenderness.  Musculoskeletal: Normal range of motion. She exhibits tenderness. She exhibits no edema.  2cm V shaped laceration on distal ulnar right index finger, no active bleeding  Neurological: She is alert and oriented to person, place, and time. She has normal strength. No cranial nerve deficit or sensory deficit.  Moderate dysarthria at baseline  Skin: Skin is warm and dry. No rash noted.  Psychiatric: She has a normal mood and affect.    ED Course  Procedures (including critical care time) Labs Review Labs Reviewed  CBC - Abnormal; Notable for the following:    RBC 3.59 (*)    Hemoglobin 10.6 (*)    HCT 32.4 (*)    All other components within normal limits  PROTIME-INR - Abnormal; Notable for the following:    Prothrombin Time 31.0 (*)    INR 3.12 (*)    All other components within normal limits  URINALYSIS, ROUTINE W REFLEX MICROSCOPIC - Abnormal; Notable for the following:    APPearance HAZY (*)    Bilirubin Urine SMALL (*)    Ketones, ur 15 (*)    Leukocytes, UA MODERATE (*)    All other components within normal limits  COMPREHENSIVE METABOLIC PANEL - Abnormal; Notable for the following:    Creatinine, Ser 1.68 (*)    Albumin 3.4 (*)    Alkaline Phosphatase 151 (*)    GFR calc non Af Amer 30 (*)    GFR calc Af Amer 34 (*)    All other components within normal limits  URINE MICROSCOPIC-ADD ON - Abnormal;  Notable for the following:    Squamous Epithelial / LPF MANY (*)    Bacteria, UA FEW (*)    Casts HYALINE CASTS (*)    All other components within normal limits  URINE CULTURE   Imaging  Review No results found.  EKG Interpretation   None       MDM   1. Laceration of finger with delay in treatment, initial encounter   2. UTI (urinary tract infection)   3. Dizziness     Finger soaked in saline, dressed with hemostatic dressing. Delayed presentation, too remote for primary repair. Will need good wound care, this was discussed with family at bedside. Pt with probable UTI. This may have exacerbated her chronic gait and dizziness problems but I have concerns about her safety at home as she lives alone in a two level house. Will treat with Rocephin and admit for further eval.     Charles B. Bernette Mayers, MD 08/04/13 1907

## 2013-08-05 DIAGNOSIS — N390 Urinary tract infection, site not specified: Secondary | ICD-10-CM

## 2013-08-05 DIAGNOSIS — W19XXXA Unspecified fall, initial encounter: Secondary | ICD-10-CM

## 2013-08-05 DIAGNOSIS — I639 Cerebral infarction, unspecified: Secondary | ICD-10-CM | POA: Diagnosis present

## 2013-08-05 DIAGNOSIS — I951 Orthostatic hypotension: Principal | ICD-10-CM

## 2013-08-05 DIAGNOSIS — G2581 Restless legs syndrome: Secondary | ICD-10-CM

## 2013-08-05 LAB — CBC
MCHC: 32.6 g/dL (ref 30.0–36.0)
MCV: 91.4 fL (ref 78.0–100.0)
Platelets: 158 10*3/uL (ref 150–400)
RBC: 3.39 MIL/uL — ABNORMAL LOW (ref 3.87–5.11)
RDW: 13.8 % (ref 11.5–15.5)
WBC: 4.3 10*3/uL (ref 4.0–10.5)

## 2013-08-05 LAB — COMPREHENSIVE METABOLIC PANEL
AST: 32 U/L (ref 0–37)
Albumin: 2.9 g/dL — ABNORMAL LOW (ref 3.5–5.2)
Alkaline Phosphatase: 132 U/L — ABNORMAL HIGH (ref 39–117)
BUN: 15 mg/dL (ref 6–23)
CO2: 26 mEq/L (ref 19–32)
Calcium: 8.9 mg/dL (ref 8.4–10.5)
Chloride: 107 mEq/L (ref 96–112)
Creatinine, Ser: 1.58 mg/dL — ABNORMAL HIGH (ref 0.50–1.10)
GFR calc non Af Amer: 32 mL/min — ABNORMAL LOW (ref >90)
Glucose, Bld: 79 mg/dL (ref 70–99)
Potassium: 4.3 mEq/L (ref 3.5–5.1)
Total Bilirubin: 0.3 mg/dL (ref 0.3–1.2)

## 2013-08-05 LAB — PROTIME-INR: Prothrombin Time: 35.6 seconds — ABNORMAL HIGH (ref 11.6–15.2)

## 2013-08-05 MED ORDER — METOPROLOL TARTRATE 12.5 MG HALF TABLET: 12.5000 mg | ORAL_TABLET | Freq: Two times a day (BID) | ORAL | Status: DC

## 2013-08-05 MED ORDER — WARFARIN SODIUM 2.5 MG PO TABS
2.5000 mg | ORAL_TABLET | Freq: Once | ORAL | Status: AC
  Administered 2013-08-05: 2.5 mg via ORAL
  Filled 2013-08-05: qty 1

## 2013-08-05 MED ORDER — ENSURE COMPLETE PO LIQD
237.0000 mL | Freq: Two times a day (BID) | ORAL | Status: DC
  Administered 2013-08-05 – 2013-08-07 (×3): 237 mL via ORAL

## 2013-08-05 NOTE — Progress Notes (Signed)
PATIENT DETAILS Name: Vanessa Bartlett Age: 72 y.o. Sex: female Date of Birth: 09-21-1940 Admit Date: 08/04/2013 Admitting Physician Lynden Oxford, MD ZOX:[id number] DAVIDSON, MD  Subjective: Admitted with a mechanical fall. Long-standing history of orthostatic hypotension, sees neurology on an outpatient basis.  Assessment/Plan: Principal Problem:   Fall - Suspect this is secondary to orthostatic hypotension, given her long-standing history of this. This may have been exacerbated by? UTI. - Will ask PT to evaluate, stop IV fluids.  Active Problems: History of orthostatic hypotension - Likely precipitating above - Is on Coumadin, therefore high fall risk- however has mechanical valve - For now, will stop metoprolol, is euvolemic on exam so will stop IV fluids as well. Place thigh-high TED hose, await physical therapy eval, if still exhibits orthostatic hypotension, will start on low-dose Midodrine. She wants to stay on the amitriptyline as it helps her sleep, ideally she should come off it as it may be precipitating orthostatics  UTI - Continue with Rocephin, await urine cultures.  History of mechanical mitral and aortic valve replacement- on chronic Coumadin therapy - Continue Coumadin per pharmacy. Currently INR is supratherapeutic.  History of CVA  - With a residual dysarthria and mild right hemiparesis - Continue cautiously with aspirin, and statins.  History of restless leg syndrome - Continue with Requip  History of dyslipidemia - Continue statin  History of depression - Continue Zoloft  Disposition: Remain inpatient- await PT eval for appropriate disposition  DVT Prophylaxis: Not needed as on Coumadin  Code Status: Full code   Family Communication None at bedside  Procedures:  None  CONSULTS:  None   MEDICATIONS: Scheduled Meds: . amitriptyline  75 mg Oral QHS  . aspirin EC  81 mg Oral Daily  . atorvastatin  10 mg Oral Daily  .  cefTRIAXone (ROCEPHIN)  IV  1 g Intravenous Q24H  . gabapentin  800 mg Oral BID  . lidocaine  1 patch Transdermal Q24H  . pramipexole  0.25 mg Oral QHS  . sertraline  100 mg Oral Daily  . sodium chloride  3 mL Intravenous Q12H  . Warfarin - Pharmacist Dosing Inpatient   Does not apply q1800   Continuous Infusions:  PRN Meds:.acetaminophen, nitroGLYCERIN, ondansetron (ZOFRAN) IV, ondansetron, traMADol-acetaminophen  Antibiotics: Anti-infectives   Start     Dose/Rate Route Frequency Ordered Stop   08/05/13 1900  cefTRIAXone (ROCEPHIN) 1 g in dextrose 5 % 50 mL IVPB     1 g 100 mL/hr over 30 Minutes Intravenous Every 24 hours 08/04/13 2137     08/04/13 2200  cefTRIAXone (ROCEPHIN) 1 g in dextrose 5 % 50 mL IVPB  Status:  Discontinued     1 g 100 mL/hr over 30 Minutes Intravenous Every 24 hours 08/04/13 2137 08/04/13 2137   08/04/13 1900  cefTRIAXone (ROCEPHIN) 1 g in dextrose 5 % 50 mL IVPB     1 g 100 mL/hr over 30 Minutes Intravenous  Once 08/04/13 1854 08/04/13 1954       PHYSICAL EXAM: Vital signs in last 24 hours: Filed Vitals:   08/04/13 2250 08/05/13 0609 08/05/13 0610 08/05/13 0611  BP: 134/76 104/64 121/67 84/51  Pulse: 87 74    Temp:  97.9 F (36.6 C)    TempSrc:  Oral    Resp:  18    Height:      Weight:      SpO2:  98%      Weight change:  Filed Weights   08/04/13 2139  Weight: 55.611 kg (122 lb 9.6 oz)   Body mass index is 19.8 kg/(m^2).   Gen Exam: Awake and alert, chronic dysarthria-At baseline  Neck: Supple, No JVD.   Chest: B/L Clear.   CVS: S1 S2 Regular,+ metallic click  Abdomen: soft, BS +, non tender, non distended.  Extremities: no edema, lower extremities warm to touch. Neurologic: Non Focal.   Skin: No Rash.   Wounds: N/A.    Intake/Output from previous day:  Intake/Output Summary (Last 24 hours) at 08/05/13 1028 Last data filed at 08/05/13 1610  Gross per 24 hour  Intake      0 ml  Output    400 ml  Net   -400 ml     LAB  RESULTS: CBC  Recent Labs Lab 08/04/13 1629 08/05/13 0535  WBC 7.0 4.3  HGB 10.6* 10.1*  HCT 32.4* 31.0*  PLT 185 158  MCV 90.3 91.4  MCH 29.5 29.8  MCHC 32.7 32.6  RDW 13.7 13.8    Chemistries   Recent Labs Lab 08/04/13 1629 08/05/13 0535  NA 138 142  K 4.3 4.3  CL 103 107  CO2 23 26  GLUCOSE 86 79  BUN 17 15  CREATININE 1.68* 1.58*  CALCIUM 9.4 8.9    CBG: No results found for this basename: GLUCAP,  in the last 168 hours  GFR Estimated Creatinine Clearance: 29.1 ml/min (by C-G formula based on Cr of 1.58).  Coagulation profile  Recent Labs Lab 08/04/13 1629 08/05/13 0535  INR 3.12* 3.74*    Cardiac Enzymes No results found for this basename: CK, CKMB, TROPONINI, MYOGLOBIN,  in the last 168 hours  No components found with this basename: POCBNP,  No results found for this basename: DDIMER,  in the last 72 hours No results found for this basename: HGBA1C,  in the last 72 hours No results found for this basename: CHOL, HDL, LDLCALC, TRIG, CHOLHDL, LDLDIRECT,  in the last 72 hours No results found for this basename: TSH, T4TOTAL, FREET3, T3FREE, THYROIDAB,  in the last 72 hours No results found for this basename: VITAMINB12, FOLATE, FERRITIN, TIBC, IRON, RETICCTPCT,  in the last 72 hours No results found for this basename: LIPASE, AMYLASE,  in the last 72 hours  Urine Studies No results found for this basename: UACOL, UAPR, USPG, UPH, UTP, UGL, UKET, UBIL, UHGB, UNIT, UROB, ULEU, UEPI, UWBC, URBC, UBAC, CAST, CRYS, UCOM, BILUA,  in the last 72 hours  MICROBIOLOGY: No results found for this or any previous visit (from the past 240 hour(s)).  RADIOLOGY STUDIES/RESULTS: Ct Head Wo Contrast  08/04/2013   CLINICAL DATA:  Patient on Coumadin with multiple recent falls  EXAM: CT HEAD WITHOUT CONTRAST  TECHNIQUE: Contiguous axial images were obtained from the base of the skull through the vertex without intravenous contrast.  COMPARISON:  06/08/2013,  02/15/2012  FINDINGS: No hemorrhage or extra-axial fluid. No skull fracture. Encephalomalacia left parietal lobe stable. Encephalomalacia right temporal lobe stable. Mild diffuse atrophy unchanged. No evidence of mass, acute infarct, or hydrocephalus.  IMPRESSION: No acute findings.   Electronically Signed   By: Esperanza Heir M.D.   On: 08/04/2013 17:13    Jeoffrey Massed, MD  [facility] Pager:[phone]  If 7PM-7AM, please contact night-coverage [email or web address] Password [email or web address] 08/05/2013, 10:28 AM   LOS: 1 day

## 2013-08-05 NOTE — Evaluation (Signed)
Physical Therapy Evaluation Patient Details Name: Vanessa Bartlett MRN: [id number] DOB: [birth year] Today's Date: 08/05/2013 Time: 4098-1191 PT Time Calculation (min): 44 min  PT Assessment / Plan / Recommendation History of Present Illness  pt presents with multiple falls and hx of CVAs.    Clinical Impression  Pt presents with multiple falls andhx of CVA.  Pt generally unsteady and often uses hall rail or bumps into wall in order to prevent fall.  Pt states she does this at home and uses furniture for support.  Lengthy discussion with pt and son about continued rehab at SNF to improve balance and decrease fall risk.  Will continue to follow.      PT Assessment  Patient needs continued PT services    Follow Up Recommendations  SNF    Does the patient have the potential to tolerate intense rehabilitation      Barriers to Discharge Decreased caregiver support      Equipment Recommendations  Rolling walker with 5" wheels    Recommendations for Other Services     Frequency Min 3X/week    Precautions / Restrictions Precautions Precautions: Fall Restrictions Weight Bearing Restrictions: No   Pertinent Vitals/Pain Denied pain.        Mobility  Bed Mobility Bed Mobility: Not assessed Transfers Transfers: Sit to Stand;Stand to Sit Sit to Stand: 4: Min guard;With upper extremity assist;From bed Stand to Sit: 4: Min guard;With upper extremity assist;To chair/3-in-1 Details for Transfer Assistance: cues for controlling descent to chair.   Ambulation/Gait Ambulation/Gait Assistance: 4: Min assist Ambulation Distance (Feet): 150 Feet Assistive device: None Ambulation/Gait Assistance Details: pt generally unsteady and occasionally needs use of hallway rail or catches self by bouncing off wall.  pt indicates she uses walls and furniture for support at home.  pt fatigued while still in hallway and required chair to return to room.   Gait Pattern: Step-through pattern;Decreased stride  length;Lateral trunk lean to right Stairs: No Wheelchair Mobility Wheelchair Mobility: No    Exercises     PT Diagnosis: Difficulty walking  PT Problem List: Decreased strength;Decreased activity tolerance;Decreased balance;Decreased mobility;Decreased knowledge of use of DME PT Treatment Interventions: DME instruction;Gait training;Stair training;Functional mobility training;Therapeutic activities;Therapeutic exercise;Balance training;Neuromuscular re-education;Patient/family education     PT Goals(Current goals can be found in the care plan section) Acute Rehab PT Goals Patient Stated Goal: To go to her grandson's wedding in 3 weeks.   PT Goal Formulation: With patient Time For Goal Achievement: 08/19/13 Potential to Achieve Goals: Good  Visit Information  Last PT Received On: 08/05/13 Assistance Needed: +1 History of Present Illness: pt presents with multiple falls and hx of CVAs.         Prior Functioning  Home Living Family/patient expects to be discharged to:: Private residence Living Arrangements: Alone Available Help at Discharge: Family;Available PRN/intermittently Type of Home: House Home Access: Stairs to enter [employer] of Steps: 3 Home Layout: One level Home Equipment: Cane - quad;Cane - single point Additional Comments: pt notes she has been told to use her cane, but doesn't like it and won't use it.   Prior Function Level of Independence: Independent Communication Communication: Expressive difficulties    Cognition  Cognition Arousal/Alertness: Awake/alert Behavior During Therapy: WFL for tasks assessed/performed Overall Cognitive Status: Within Functional Limits for tasks assessed    Extremity/Trunk Assessment Upper Extremity Assessment Upper Extremity Assessment: Defer to OT evaluation Lower Extremity Assessment Lower Extremity Assessment: RLE deficits/detail RLE Deficits / Details: Generally weak since CVA.  Balance  Balance Balance Assessed: No  End of Session PT - End of Session Equipment Utilized During Treatment: Gait belt Activity Tolerance: Patient limited by fatigue Patient left: in chair;with call bell/phone within reach;with family/visitor present Nurse Communication: Mobility status  GP Functional Assessment Tool Used: Clinical Judgement Functional Limitation: Mobility: Walking and moving around Mobility: Walking and Moving Around Current Status ([id number]): At least 20 percent but less than 40 percent impaired, limited or restricted Mobility: Walking and Moving Around Goal Status [id number]): 0 percent impaired, limited or restricted   Sunny Schlein, [place] [phone] 08/05/2013, 1:22 PM

## 2013-08-05 NOTE — Progress Notes (Signed)
ANTICOAGULATION CONSULT NOTE - Follow Up Consult  Pharmacy Consult for Coumadin Indication: History of MV, AVR  Allergies  Allergen Reactions  . Codeine    Vital Signs: Temp: 97.9 F (36.6 C) (11/22 0609) Temp src: Oral (11/22 0609) BP: 84/51 mmHg (11/22 0611) Pulse Rate: 74 (11/22 0609)  Labs:  Recent Labs  08/04/13 1629 08/05/13 0535  HGB 10.6* 10.1*  HCT 32.4* 31.0*  PLT 185 158  LABPROT 31.0* 35.6*  INR 3.12* 3.74*  CREATININE 1.68* 1.58*    Estimated Creatinine Clearance: 29.1 ml/min (by C-G formula based on Cr of 1.58).   Medical History: Past Medical History  Diagnosis Date  . RHEUMATIC HEART DISEASE   . HYPERTENSION   . MITRAL VALVE REPLACEMENT, HX OF   . DEPRESSION   . AORTIC VALVE REPLACEMENT, HX OF   . Headache   . Hx of vertigo   . Chronic low back pain   . GERD (gastroesophageal reflux disease)   . Osteoporosis   . Dyslipidemia   . Cerebrovascular disease     with left brain stroke, residual aphasia  . Orthostatic hypotension 02/22/2013  . Restless legs syndrome (RLS) 02/22/2013    Assessment: 72 year old female on Coumadin PTA for AVR/MVR admitted with frequent falls, finger laceration and UTI (started on Rocephin).    INR on admission was therapeutic at 3.12. Home dosing regimen was resumed 11/21. INR now slightly supratherapeutic at 3.74. Due to significant jump in INR from yesterday (3.12 > 3.74), will be more conservative with tonight's dose.  Dose PTA = 5 mg daily except for 2.5 mg MWF   No bleeding complications noted.  Goal of Therapy:  INR goal = 2.5 to 3.5 Monitor platelets by anticoagulation protocol: Yes   Plan:  1) Coumadin 2.5 mg po x 1 dose tonight 2) Daily INR 3) F/U bleeding complications  Tayari Yankee C. Auden Wettstein, PharmD Clinical Pharmacist-Resident Pager: [phone] Pharmacy: [phone] 08/05/2013 10:47 AM

## 2013-08-05 NOTE — Evaluation (Addendum)
Clinical/Bedside Swallow Evaluation Patient Details  Name: Vanessa Bartlett MRN: [id number] Date of Birth: 29-Aug-1941  Today's Date: 08/05/2013 Time: 4098-1191 SLP Time Calculation (min): 48 min  Past Medical History:  Past Medical History  Diagnosis Date  . RHEUMATIC HEART DISEASE   . HYPERTENSION   . MITRAL VALVE REPLACEMENT, HX OF   . DEPRESSION   . AORTIC VALVE REPLACEMENT, HX OF   . Headache   . Hx of vertigo   . Chronic low back pain   . GERD (gastroesophageal reflux disease)   . Osteoporosis   . Dyslipidemia   . Cerebrovascular disease     with left brain stroke, residual aphasia  . Orthostatic hypotension 02/22/2013  . Restless legs syndrome (RLS) 02/22/2013   Past Surgical History:  Past Surgical History  Procedure Laterality Date  . Aortic valve replacement    . Mitral valve replacement 1990    . Lumbar disc surgery    . Arthroscopic  knee  surgery    . Cholecystectomy    . Dilation and curettage of uterus    . Tubal ligation    . Right knee surgery in 1993.    . Abdominal hysterectomy    . Cataract extraction Bilateral   . Tonsillectomy    . Appendectomy     HPI:  Pt with history of stroke and valve replacement on coumadin reports she cut her right index finger on a plate last night around 10pm and had difficulty controlling bleeding throughout the night. Bleeding on arrival but had dressing placed in triage and improved.   She mentions that yesterday while doing her household chores she cut her right index finger and had ongoing on and off bleeding throughout the night. She denies any bleeding anywhere else.  Along with that she also mentioned that she has been having recurrent dizziness. She mentions that she has diagnosis of orthostatic hypotension and has been following with a neurologist but her symptoms are progressively worsening over last few weeks with frequent falls. Her last one was yesterday. She was at home without any support and uses furniture and  walls to help her support. She lives at home alone and does her medication on her own. BSE ordered to assess risk for aspiration and recommend safest, PO diet due to recent difficulty swallowing solids as reported by patient's son.  Patient's son states intermittent coughing with thin liquids and choking episodes with solids.   Assessment / Plan / Recommendation Clinical Impression  BSE completed.  Suspect baseline oral and pharyngeal phase dysphagia.   Spillage right anterior with puree and trials of solids during oral prep stage secondary to poor labial closure and labial weakness. Poor bolus cohesion with solids due to reduced lingual coordination.  Mastication of regular solids further affected due to lack of dentition on bottom row.  Audible, wet swallows noted with thin liquid trials with slight wet vocal quality with swallows in succesion.  Due to history of stroke, sensory involvement, and s/s present question silent penetration to cords with thin liquids with larger sips at fast rate.    Recommend to downgrade to dysphagia 2 ( finely chopped) to increase safety and due to dentition.   Continue thin liquids with intermittent supevision with meals to cue patient to adhere to aspiration precautions.  Diagnostic treatment completed focusing on providing education to patient and caregivers on diet recommendations and POC.   ST to follow briefly in acute care setting for diet tolerance.  Recommend 24 hour supervision and  assist at time of discharge as patient noted with decreased safety awareness.      Aspiration Risk  Mild    Diet Recommendation Dysphagia 2 (Fine chop);Thin liquid   Liquid Administration via: Cup;Straw Medication Administration: Whole meds with liquid Supervision: Patient able to self feed;Intermittent supervision to cue for compensatory strategies Compensations: Slow rate;Small sips/bites;Clear throat intermittently Postural Changes and/or Swallow Maneuvers: Out of bed for  meals;Seated upright 90 degrees;Upright 30-60 min after meal    Other  Recommendations Oral Care Recommendations: Oral care BID   Follow Up Recommendations  Skilled Nursing facility    Frequency and Duration min 1 x/week  2 weeks       SLP Swallow Goals  Please refer to Care Plans for goals   Swallow Study Prior Functional Status  Type of Home: House Available Help at Discharge: Family;Available PRN/intermittently    General Date of Onset: 08/04/13 HPI: Pt with history of stroke and valve replacement on coumadin reports she cut her right index finger on a plate last night around 10pm and had difficulty controlling bleeding throughout the night. Bleeding on arrival but had dressing placed in triage and improved.   Type of Study: Bedside swallow evaluation Diet Prior to this Study: Regular;Thin liquids Respiratory Status: Room air History of Recent Intubation: No Behavior/Cognition: Alert;Cooperative;Pleasant mood;Distractible Oral Cavity - Dentition: Dentures, top;Missing dentition Self-Feeding Abilities: Able to feed self Patient Positioning: Postural control adequate for testing Baseline Vocal Quality: Breathy Volitional Cough: Strong Volitional Swallow: Able to elicit    Oral/Motor/Sensory Function Overall Oral Motor/Sensory Function: Impaired at baseline   Ice Chips Ice chips: Not tested   Thin Liquid Thin Liquid: Impaired Presentation: Cup;Spoon Pharyngeal  Phase Impairments: Suspected delayed Swallow Other Comments: Audible swallow    Nectar Thick Nectar Thick Liquid: Not tested   Honey Thick Honey Thick Liquid: Not tested   Puree Puree: Impaired Presentation: Self Fed Oral Phase Impairments: Reduced labial seal Oral Phase Functional Implications: Right anterior spillage Pharyngeal Phase Impairments: Suspected delayed Swallow;Decreased hyoid-laryngeal movement   Solid   GO    Solid: Impaired Presentation: Self Fed Oral Phase Impairments: Reduced labial  seal Oral Phase Functional Implications: Right anterior spillage Pharyngeal Phase Impairments: Suspected delayed Swallow;Decreased hyoid-laryngeal movement      Moreen Fowler MS, CCC-SLP [phone] [facility] 08/05/2013,3:48 PM

## 2013-08-05 NOTE — Progress Notes (Signed)
INITIAL NUTRITION ASSESSMENT  DOCUMENTATION CODES Per approved criteria  -Not Applicable   INTERVENTION: - Diet per SLP - Ensure Complete BID - Unit RD to continue to monitor   NUTRITION DIAGNOSIS: Swallowing difficulty related to hx of CVA as evidenced by pt and son report.   Goal: Pt able to safely consume >90% of meals/supplements  Monitor:  Weights, labs, intake, dysphagia   Reason for Assessment: MST  72 y.o. female  Admitting Dx: Fall  ASSESSMENT: Pt with history of orthostatic hypotension, history of CVA with residue expressive aphasia and right-sided weakness, hypertension, dyslipidemia, history of mitral and aortic wall replacement on Coumadin. The patient is presenting with injury of her right index finger and recurrent fall and dizziness. Hx of PEG in 2001.   Met with pt and son who report pt has had broken bottom teeth for the past 6 months and report pt has lost 15-17 pounds unintentionally during this time frame. Weight trend shows pt's weight has been stable in the past 5 months. Pt reports typically eating 2 meals/day however son reports sometimes it's only 1 meal/day depending on how pt feels. Not on any nutritional supplements at home. Reports sometimes having problems swallowing and son reports a few times he had to help pt at home when she was almost choking. Denies any nausea today and ate 100% of her lunch tray.    Height: Ht Readings from Last 1 Encounters:  08/04/13 5\' 6"  (1.676 m)    Weight: Wt Readings from Last 1 Encounters:  08/04/13 122 lb 9.6 oz (55.611 kg)    Ideal Body Weight: 130 lb  % Ideal Body Weight: 94%  Wt Readings from Last 10 Encounters:  08/04/13 122 lb 9.6 oz (55.611 kg)  05/17/13 121 lb (54.885 kg)  02/22/13 122 lb (55.339 kg)  01/29/12 133 lb (60.328 kg)  09/19/12 129 lb (58.514 kg)  08/20/11 136 lb (61.689 kg)  07/09/10 137 lb (62.143 kg)  06/20/10 67 lb (30.391 kg)    Usual Body Weight: 135 lb per son  % Usual  Body Weight: 90%  BMI:  Body mass index is 19.8 kg/(m^2).  Estimated Nutritional Needs: Kcal: 1450-1650 Protein: 65-75g Fluid: 1.4-1.6L/day  Skin: Laceration on right index finger  Diet Order: General  EDUCATION NEEDS: -Education needs addressed - briefly discussed ways to increase calories/protein in diet at home   Intake/Output Summary (Last 24 hours) at 08/05/13 1436 Last data filed at 08/05/13 0312  Gross per 24 hour  Intake      0 ml  Output    400 ml  Net   -400 ml    Last BM: 11/21  Labs:   Recent Labs Lab 08/04/13 1629 08/05/13 0535  NA 138 142  K 4.3 4.3  CL 103 107  CO2 23 26  BUN 17 15  CREATININE 1.68* 1.58*  CALCIUM 9.4 8.9  GLUCOSE 86 79    CBG (last 3)  No results found for this basename: GLUCAP,  in the last 72 hours  Scheduled Meds: . amitriptyline  75 mg Oral QHS  . aspirin EC  81 mg Oral Daily  . atorvastatin  10 mg Oral Daily  . cefTRIAXone (ROCEPHIN)  IV  1 g Intravenous Q24H  . gabapentin  800 mg Oral BID  . lidocaine  1 patch Transdermal Q24H  . pramipexole  0.25 mg Oral QHS  . sertraline  100 mg Oral Daily  . sodium chloride  3 mL Intravenous Q12H  . warfarin  2.5  mg Oral ONCE-1800  . Warfarin - Pharmacist Dosing Inpatient   Does not apply q1800    Continuous Infusions:   Past Medical History  Diagnosis Date  . RHEUMATIC HEART DISEASE   . HYPERTENSION   . MITRAL VALVE REPLACEMENT, HX OF   . DEPRESSION   . AORTIC VALVE REPLACEMENT, HX OF   . Headache   . Hx of vertigo   . Chronic low back pain   . GERD (gastroesophageal reflux disease)   . Osteoporosis   . Dyslipidemia   . Cerebrovascular disease     with left brain stroke, residual aphasia  . Orthostatic hypotension 02/22/2013  . Restless legs syndrome (RLS) 02/22/2013    Past Surgical History  Procedure Laterality Date  . Aortic valve replacement    . Mitral valve replacement 1990    . Lumbar disc surgery    . Arthroscopic  knee  surgery    .  Cholecystectomy    . Dilation and curettage of uterus    . Tubal ligation    . Right knee surgery in 1993.    . Abdominal hysterectomy    . Cataract extraction Bilateral   . Tonsillectomy    . Appendectomy      Levon Hedger MS, RD, LDN [phone] Weekend/After Hours Pager

## 2013-08-06 DIAGNOSIS — R42 Dizziness and giddiness: Secondary | ICD-10-CM

## 2013-08-06 LAB — URINE CULTURE: Colony Count: 50000

## 2013-08-06 LAB — PROTIME-INR: Prothrombin Time: 33.5 seconds — ABNORMAL HIGH (ref 11.6–15.2)

## 2013-08-06 MED ORDER — DIPHENHYDRAMINE HCL 25 MG PO CAPS
25.0000 mg | ORAL_CAPSULE | Freq: Once | ORAL | Status: AC
  Administered 2013-08-07: 25 mg via ORAL
  Filled 2013-08-06: qty 1

## 2013-08-06 MED ORDER — MIDODRINE HCL 5 MG PO TABS
5.0000 mg | ORAL_TABLET | Freq: Two times a day (BID) | ORAL | Status: DC
  Administered 2013-08-06 – 2013-08-07 (×2): 5 mg via ORAL
  Filled 2013-08-06 (×4): qty 1

## 2013-08-06 MED ORDER — WARFARIN SODIUM 2.5 MG PO TABS
2.5000 mg | ORAL_TABLET | Freq: Once | ORAL | Status: AC
  Administered 2013-08-06: 2.5 mg via ORAL
  Filled 2013-08-06: qty 1

## 2013-08-06 NOTE — Progress Notes (Signed)
PATIENT DETAILS Name: Vanessa Bartlett Age: 72 y.o. Sex: female Date of Birth: 02-22-1941 Admit Date: 08/04/2013 Admitting Physician Lynden Oxford, MD AVW:UJWJX,BJYNWG DAVIDSON, MD  Subjective: Admitted with a mechanical fall. Long-standing history of orthostatic hypotension, sees neurology on an outpatient basis. No major issues, however it is still orthostatic.  Assessment/Plan: Principal Problem:   Fall - Suspect this is secondary to orthostatic hypotension, given her long-standing history of this. This may have been exacerbated by? UTI and amitriptyline. - Appreciate PT evaluation-needs SNF  Active Problems: History of orthostatic hypotension - Likely precipitating above - Is on Coumadin, therefore high fall risk- however has mechanical valve- Gavin Pound will need to continue. - Since persistently orthostatic, metoprolol was discontinued on 11/22, thigh high TED hose has been placed, since still persistently orthostatic, we'll started low-dose Midodrine -She wants to stay on the amitriptyline as it helps her sleep, ideally she should come off it as it may be precipitating orthostatics  UTI - Continue with Rocephin, await urine cultures- preliminary data shows Escherichia coli  History of mechanical mitral and aortic valve replacement- on chronic Coumadin therapy - Continue Coumadin per pharmacy. Currently INR is therapeutic.  History of CVA  - With a residual dysarthria and mild right hemiparesis - Continue cautiously with aspirin, and statins.  History of restless leg syndrome - Continue with Requip  History of dyslipidemia - Continue statin  History of depression - Continue Zoloft  Dysphagia - Seen by speech therapy-recommended for dysphagia 2 diet, patient agreeable.  Disposition: Remain inpatient- SNF for discharge suspect can go tomorrow  DVT Prophylaxis: Not needed as on Coumadin  Code Status: Full code   Family Communication None at  bedside  Procedures:  None  CONSULTS:  None   MEDICATIONS: Scheduled Meds: . amitriptyline  75 mg Oral QHS  . aspirin EC  81 mg Oral Daily  . atorvastatin  10 mg Oral Daily  . cefTRIAXone (ROCEPHIN)  IV  1 g Intravenous Q24H  . feeding supplement (ENSURE COMPLETE)  237 mL Oral BID BM  . gabapentin  800 mg Oral BID  . lidocaine  1 patch Transdermal Q24H  . midodrine  5 mg Oral BID WC  . pramipexole  0.25 mg Oral QHS  . sertraline  100 mg Oral Daily  . sodium chloride  3 mL Intravenous Q12H  . Warfarin - Pharmacist Dosing Inpatient   Does not apply q1800   Continuous Infusions:  PRN Meds:.acetaminophen, nitroGLYCERIN, ondansetron (ZOFRAN) IV, ondansetron, traMADol-acetaminophen  Antibiotics: Anti-infectives   Start     Dose/Rate Route Frequency Ordered Stop   08/05/13 1900  cefTRIAXone (ROCEPHIN) 1 g in dextrose 5 % 50 mL IVPB     1 g 100 mL/hr over 30 Minutes Intravenous Every 24 hours 08/04/13 2137     08/04/13 2200  cefTRIAXone (ROCEPHIN) 1 g in dextrose 5 % 50 mL IVPB  Status:  Discontinued     1 g 100 mL/hr over 30 Minutes Intravenous Every 24 hours 08/04/13 2137 08/04/13 2137   08/04/13 1900  cefTRIAXone (ROCEPHIN) 1 g in dextrose 5 % 50 mL IVPB     1 g 100 mL/hr over 30 Minutes Intravenous  Once 08/04/13 1854 08/04/13 1954       PHYSICAL EXAM: Vital signs in last 24 hours: Filed Vitals:   08/06/13 0501 08/06/13 0754 08/06/13 0757 08/06/13 0800  BP: 122/68 96/47 103/61 93/52  Pulse: 88 77    Temp: 97.8 F (36.6 C) 98 F (36.7 C)  TempSrc: Oral Oral    Resp: 16 16 18 18   Height:      Weight:      SpO2:  94% 96% 95%    Weight change:  Filed Weights   08/04/13 2139  Weight: 55.611 kg (122 lb 9.6 oz)   Body mass index is 19.8 kg/(m^2).   Gen Exam: Awake and alert, chronic dysarthria-At baseline  Neck: Supple, No JVD.   Chest: B/L Clear.   CVS: S1 S2 Regular,+ metallic click  Abdomen: soft, BS +, non tender, non distended.  Extremities: no  edema, lower extremities warm to touch. Neurologic: Non Focal.   Skin: No Rash.   Wounds: N/A.    Intake/Output from previous day:  Intake/Output Summary (Last 24 hours) at 08/06/13 0957 Last data filed at 08/05/13 2202  Gross per 24 hour  Intake     53 ml  Output      1 ml  Net     52 ml     LAB RESULTS: CBC  Recent Labs Lab 08/04/13 1629 08/05/13 0535  WBC 7.0 4.3  HGB 10.6* 10.1*  HCT 32.4* 31.0*  PLT 185 158  MCV 90.3 91.4  MCH 29.5 29.8  MCHC 32.7 32.6  RDW 13.7 13.8    Chemistries   Recent Labs Lab 08/04/13 1629 08/05/13 0535  NA 138 142  K 4.3 4.3  CL 103 107  CO2 23 26  GLUCOSE 86 79  BUN 17 15  CREATININE 1.68* 1.58*  CALCIUM 9.4 8.9    CBG: No results found for this basename: GLUCAP,  in the last 168 hours  GFR Estimated Creatinine Clearance: 29.1 ml/min (by C-G formula based on Cr of 1.58).  Coagulation profile  Recent Labs Lab 08/04/13 1629 08/05/13 0535 08/06/13 0605  INR 3.12* 3.74* 3.46*    Cardiac Enzymes No results found for this basename: CK, CKMB, TROPONINI, MYOGLOBIN,  in the last 168 hours  No components found with this basename: POCBNP,  No results found for this basename: DDIMER,  in the last 72 hours No results found for this basename: HGBA1C,  in the last 72 hours No results found for this basename: CHOL, HDL, LDLCALC, TRIG, CHOLHDL, LDLDIRECT,  in the last 72 hours No results found for this basename: TSH, T4TOTAL, FREET3, T3FREE, THYROIDAB,  in the last 72 hours No results found for this basename: VITAMINB12, FOLATE, FERRITIN, TIBC, IRON, RETICCTPCT,  in the last 72 hours No results found for this basename: LIPASE, AMYLASE,  in the last 72 hours  Urine Studies No results found for this basename: UACOL, UAPR, USPG, UPH, UTP, UGL, UKET, UBIL, UHGB, UNIT, UROB, ULEU, UEPI, UWBC, URBC, UBAC, CAST, CRYS, UCOM, BILUA,  in the last 72 hours  MICROBIOLOGY: Recent Results (from the past 240 hour(s))  URINE CULTURE      Status: None   Collection Time    08/04/13  5:03 PM      Result Value Range Status   Specimen Description URINE, CLEAN CATCH   Final   Special Requests NONE   Final   Culture  Setup Time     Final   Value: 08/04/2013 22:06     Performed at [facility] Count     Final   Value: 50,000 COLONIES/ML     Performed at [facility]   Culture     Final   Value: ESCHERICHIA COLI     Performed at [facility]   Report Status PENDING  Incomplete    RADIOLOGY STUDIES/RESULTS: Ct Head Wo Contrast  08/04/2013   CLINICAL DATA:  Patient on Coumadin with multiple recent falls  EXAM: CT HEAD WITHOUT CONTRAST  TECHNIQUE: Contiguous axial images were obtained from the base of the skull through the vertex without intravenous contrast.  COMPARISON:  06/08/2013, 02/15/2012  FINDINGS: No hemorrhage or extra-axial fluid. No skull fracture. Encephalomalacia left parietal lobe stable. Encephalomalacia right temporal lobe stable. Mild diffuse atrophy unchanged. No evidence of mass, acute infarct, or hydrocephalus.  IMPRESSION: No acute findings.   Electronically Signed   By: Esperanza Heir M.D.   On: 08/04/2013 17:13    Jeoffrey Massed, MD  [facility] Pager:[phone]  If 7PM-7AM, please contact night-coverage [email or web address] Password [facility] 08/06/2013, 9:57 AM   LOS: 2 days

## 2013-08-06 NOTE — Progress Notes (Signed)
ANTICOAGULATION CONSULT NOTE - Follow Up Consult  Pharmacy Consult for Coumadin Indication: History of MV, AVR  Allergies  Allergen Reactions  . Codeine    Vital Signs: Temp: 98 F (36.7 C) (11/23 0754) Temp src: Oral (11/23 0754) BP: 93/52 mmHg (11/23 0800) Pulse Rate: 77 (11/23 0754)  Labs:  Recent Labs  08/04/13 1629 08/05/13 0535 08/06/13 0605  HGB 10.6* 10.1*  --   HCT 32.4* 31.0*  --   PLT 185 158  --   LABPROT 31.0* 35.6* 33.5*  INR 3.12* 3.74* 3.46*  CREATININE 1.68* 1.58*  --     Estimated Creatinine Clearance: 29.1 ml/min (by C-G formula based on Cr of 1.58).   Medical History: Past Medical History  Diagnosis Date  . RHEUMATIC HEART DISEASE   . HYPERTENSION   . MITRAL VALVE REPLACEMENT, HX OF   . DEPRESSION   . AORTIC VALVE REPLACEMENT, HX OF   . Headache   . Hx of vertigo   . Chronic low back pain   . GERD (gastroesophageal reflux disease)   . Osteoporosis   . Dyslipidemia   . Cerebrovascular disease     with left brain stroke, residual aphasia  . Orthostatic hypotension 02/22/2013  . Restless legs syndrome (RLS) 02/22/2013    Assessment: 72 year old female on Coumadin PTA for AVR/MVR admitted with frequent falls, finger laceration and UTI (started on Rocephin).    Dose PTA = 5 mg daily except for 2.5 mg MWF   INR on admission was therapeutic at 3.12. Home dosing regimen was resumed 11/21. INR was slightly supratherapeutic yesterday at 3.74.  Today, INR is therapeutic at 3.46. Since INR is at high end of goal range, today's dose will be more conservative.  No bleeding complications noted.  Goal of Therapy:  INR goal = 2.5 to 3.5 Monitor platelets by anticoagulation protocol: Yes   Plan:  1) Coumadin 2.5 mg po x 1 dose tonight 2) Daily INR 3) F/U bleeding complications  Ashlin Hidalgo C. Alania Overholt, PharmD Clinical Pharmacist-Resident Pager: [phone] Pharmacy: [phone] 08/06/2013 1:30 PM

## 2013-08-06 NOTE — Consult Note (Addendum)
WOC wound consult note Reason for Consult: Consult requested for right first finger.  Pt it at home and had large amt bleeding. Wound type: Full thickness Measurement:.5X.1, well approximated over injury at this time and unable to assess previous depth. Wound bed: Pink and moist Drainage (amount, consistency, odor) No odor or further bleeding Periwound: Intact Dressing procedure/placement/frequency:Applied a steri-strip to assist with approximating skin together while area is healing.  Foam dressing applied to protect and promote healing. Family member at bedside to discuss plan of care after discharge; pt can use neosporin and band aid after discharge. Please re-consult if further assistance is needed.  Thank-you,  Cammie Mcgee MSN, RN, CWOCN, [place], CNS [phone]

## 2013-08-07 LAB — PROTIME-INR
INR: 2.49 — ABNORMAL HIGH (ref 0.00–1.49)
Prothrombin Time: 26.1 seconds — ABNORMAL HIGH (ref 11.6–15.2)

## 2013-08-07 MED ORDER — LIDOCAINE 5 % EX PTCH: 1.0000 | MEDICATED_PATCH | CUTANEOUS | Status: DC

## 2013-08-07 MED ORDER — WARFARIN SODIUM 5 MG PO TABS
5.0000 mg | ORAL_TABLET | Freq: Once | ORAL | Status: DC
  Filled 2013-08-07: qty 1

## 2013-08-07 MED ORDER — MIDODRINE HCL 5 MG PO TABS: 5.0000 mg | ORAL_TABLET | Freq: Three times a day (TID) | ORAL | Status: AC

## 2013-08-07 MED ORDER — CIPROFLOXACIN HCL 250 MG PO TABS
250.0000 mg | ORAL_TABLET | Freq: Every day | ORAL | Status: DC
  Administered 2013-08-07: 250 mg via ORAL
  Filled 2013-08-07: qty 1

## 2013-08-07 MED ORDER — ENSURE COMPLETE PO LIQD: 237.0000 mL | Freq: Two times a day (BID) | ORAL | Status: AC

## 2013-08-07 MED ORDER — CIPROFLOXACIN HCL 250 MG PO TABS: 250.0000 mg | ORAL_TABLET | Freq: Every day | ORAL | Status: DC

## 2013-08-07 MED ORDER — MIDODRINE HCL 5 MG PO TABS
5.0000 mg | ORAL_TABLET | Freq: Three times a day (TID) | ORAL | Status: DC
  Administered 2013-08-07: 13:00:00 5 mg via ORAL
  Filled 2013-08-07 (×3): qty 1

## 2013-08-07 NOTE — Care Management Note (Signed)
    Page 1 of 1   08/07/2013     5:09:49 PM   CARE MANAGEMENT NOTE 08/07/2013  Patient:  Vanessa Bartlett, Vanessa Bartlett   Account Number:  [id number]  Date Initiated:  08/07/2013  Documentation initiated by:  Letha Cape  Subjective/Objective Assessment:   dx uti  admit- lives alone.     Action/Plan:   Anticipated DC Date:  08/07/2013   Anticipated DC Plan:  SKILLED NURSING FACILITY  In-house referral  Clinical Social Worker      DC Planning Services  CM consult      Choice offered to / List presented to:             Status of service:  Completed, signed off Medicare Important Message given?   (If response is "NO", the following Medicare IM given date fields will be blank) Date Medicare IM given:   Date Additional Medicare IM given:    Discharge Disposition:  SKILLED NURSING FACILITY  Per UR Regulation:  Reviewed for med. necessity/level of care/duration of stay  If discussed at Long Length of Stay Meetings, dates discussed:    Comments:

## 2013-08-07 NOTE — Progress Notes (Signed)
Speech Language Pathology Treatment: Dysphagia  Patient Details Name: Vanessa Bartlett MRN: [id number] DOB: 1941-02-25 Today's Date: 08/07/2013 Time: 4098-1191 SLP Time Calculation (min): 8 min  Assessment / Plan / Recommendation Clinical Impression  F/u after clinical swallow eval on 11/22: pt finishing Dysphagia 2, thin liquid lunch upon entering room.  Noted with right anterior spillage (mild), slowed but effective mastication.  No overt s/s of aspiration noted, even when taxed with large boluses of thin liquid.  Min verbal cues provided for basic precautions re: rate/bolus size.    Pt appears to have a baseline aphasia - able to convey basic ideas with gesture supplementing her verbalizations, but struggling to communicate.  Despite chronicity of communication deficits, pt may benefit from resuming SLP services at SNF to assist with improving her speech/language to more functional levels.  Will also require f/u to address swallowing and advance diet as appropriate.  SLP for D/C today to SNF.     HPI HPI: Pt with history of stroke and valve replacement on coumadin reports she cut her right index finger on a plate last night around 10pm and had difficulty controlling bleeding throughout the night. Bleeding on arrival but had dressing placed in triage and improved.        SLP Plan  F/U SLP at SNF    Recommendations Diet recommendations: Dysphagia 2 (fine chop);Thin liquid Liquids provided via: Cup Medication Administration: Whole meds with liquid Supervision: Patient able to self feed Compensations: Slow rate;Small sips/bites;Clear throat intermittently Postural Changes and/or Swallow Maneuvers: Out of bed for meals;Seated upright 90 degrees;Upright 30-60 min after meal              Plan: All goals met  Vanessa Bartlett L. Vanessa Bartlett, [place] CCC/SLP Pager [phone]      Vanessa Bartlett Vanessa Bartlett 08/07/2013, 3:16 PM

## 2013-08-07 NOTE — Evaluation (Signed)
Occupational Therapy Evaluation Patient Details Name: Vanessa Bartlett MRN: [id number] DOB: [birth year] Today's Date: 08/07/2013 Time: 1135-1205 OT Time Calculation (min): 30 min  OT Assessment / Plan / Recommendation History of present illness pt presents with multiple falls and hx of CVAs.     Clinical Impression   Pt presents with generalized weakness, incoordination, balance deficits, and impaired expressive communication.  Pt requires assist for mobility and ADL.  She stated she did not want to be a burden on her children.  She is agreeable to SNF for short term rehab.       OT Assessment  All further OT needs can be met in the next venue of care    Follow Up Recommendations  SNF    Barriers to Discharge      Equipment Recommendations  None recommended by OT    Recommendations for Other Services    Frequency       Precautions / Restrictions Precautions Precautions: Fall Restrictions Weight Bearing Restrictions: No   Pertinent Vitals/Pain No pain, VSS    ADL  Eating/Feeding: Independent Where Assessed - Eating/Feeding: Edge of bed Grooming: Wash/dry hands;Min guard Where Assessed - Grooming: Unsupported standing Upper Body Bathing: Supervision/safety Where Assessed - Upper Body Bathing: Unsupported sitting Lower Body Bathing: Min guard Where Assessed - Lower Body Bathing: Unsupported sitting;Supported sit to stand Upper Body Dressing: Supervision/safety Where Assessed - Upper Body Dressing: Unsupported sitting Lower Body Dressing: Min guard Where Assessed - Lower Body Dressing: Unsupported sitting;Supported sit to stand Toilet Transfer: Min Pension scheme manager Method: Sit to Barista: Bedside commode Toileting - Architect and Hygiene: Supervision/safety Where Assessed - Engineer, mining and Hygiene: Sit on 3-in-1 or toilet Equipment Used: Gait belt ADL Comments: Pt able to cross her foot over opposite knee to  donn and doff socks.  Pt is constant motion.  Talks incessantly and does not appear to appreciate that she is difficult to understand.  Did not respond to attempting to answer questions with yes/no, repeatedly attempting to give more information making it difficult to communicate with her.    OT Diagnosis: Generalized weakness;Cognitive deficits;Ataxia  OT Problem List: Decreased activity tolerance;Impaired balance (sitting and/or standing);Decreased coordination;Decreased knowledge of use of DME or AE;Decreased safety awareness;Impaired UE functional use;Impaired tone;Decreased strength OT Treatment Interventions:     OT Goals(Current goals can be found in the care plan section) Acute Rehab OT Goals Patient Stated Goal: agreeable to SNF for ST rehab  Visit Information  Last OT Received On: 08/07/13 Assistance Needed: +1 History of Present Illness: pt presents with multiple falls and hx of CVAs.         Prior Functioning     Home Living Family/patient expects to be discharged to:: Skilled nursing facility Prior Function Level of Independence: Independent Communication Communication: Expressive difficulties Dominant Hand: Right         Vision/Perception Vision - History Patient Visual Report: No change from baseline   Cognition  Cognition Arousal/Alertness: Awake/alert Behavior During Therapy: Restless Overall Cognitive Status: Difficult to assess Difficult to assess due to: Impaired communication    Extremity/Trunk Assessment Upper Extremity Assessment Upper Extremity Assessment: RUE deficits/detail;LUE deficits/detail RUE Deficits / Details: ataxia LUE Deficits / Details: ataxia Lower Extremity Assessment Lower Extremity Assessment: Defer to PT evaluation RLE Deficits / Details: Generally weak since CVA.   Cervical / Trunk Assessment Cervical / Trunk Assessment: Normal     Mobility Bed Mobility Bed Mobility: Supine to Sit;Sit to Supine Supine to Sit:  7:  Independent Sit to Supine: 7: Independent Transfers Transfers: Sit to Stand;Stand to Sit Sit to Stand: 4: Min guard Stand to Sit: 4: Min guard Details for Transfer Assistance: cues for controlling descent      Exercise     Balance Balance Balance Assessed: Yes Dynamic Sitting Balance Dynamic Sitting - Balance Support: Feet supported Dynamic Sitting - Level of Assistance: 5: Stand by assistance Dynamic Sitting - Balance Activities: Forward lean/weight shifting Static Standing Balance Static Standing - Balance Support: No upper extremity supported Static Standing - Level of Assistance: 5: Stand by assistance   End of Session OT - End of Session Activity Tolerance: Patient tolerated treatment well Patient left: in bed;with call bell/phone within reach (SW in room)  GO     Evern Bio 08/07/2013, 12:33 PM [phone]

## 2013-08-07 NOTE — Clinical Social Work Psychosocial (Signed)
Clinical Social Work Department BRIEF PSYCHOSOCIAL ASSESSMENT 08/07/2013  Patient:  Vanessa Bartlett, Vanessa Bartlett     Account Number:  [id number]     Admit date:  08/04/2013  Clinical Social Worker:  Lavell Luster  Date/Time:  08/07/2013 10:00 AM  Referred by:  Physician  Date Referred:  08/07/2013 Referred for  SNF Placement   Other Referral:   Interview type:  Family Other interview type:   Patient has great difficulty in speaking and requests that CSW speak with son Vanessa Bartlett.    PSYCHOSOCIAL DATA Living Status:  ALONE Admitted from facility:   Level of care:   Primary support name:  Vanessa Bartlett [phone] Primary support relationship to patient:  CHILD, ADULT Degree of support available:   Support is strong.    CURRENT CONCERNS Current Concerns  Post-Acute Placement   Other Concerns:    SOCIAL WORK ASSESSMENT / PLAN CSW met with patient to discuss recommendation for SNF. Patient has great difficulty with speaking due to aphasia and requests CSW speak with son Vanessa Bartlett. Mr. Storts was contacted via phone. Patient and son are agreeable to SNF placement. Patient wants to be placed in [facility].   Assessment/plan status:  Psychosocial Support/Ongoing Assessment of Needs Other assessment/ plan:   Complete FL2, Fax, PASRR   Information/referral to community resources:   CSW contact information and SNF list given to patient.    PATIENT'S/FAMILY'S RESPONSE TO PLAN OF CARE: Patient and son are agreeable to SNF placement and understand that patient will DC on 08/07/13. Patient and son were pleasant and appreciative of CSW contact. Patient awaits bed offers. CSW will continue to follow.       Roddie Mc, [place], [place], [phone]

## 2013-08-07 NOTE — Clinical Social Work Placement (Signed)
Clinical Social Work Department CLINICAL SOCIAL WORK PLACEMENT NOTE 08/07/2013  Patient:  Vanessa Bartlett, Vanessa Bartlett  Account Number:  [id number] Admit date:  08/04/2013  Clinical Social Worker:  Cherre Blanc, [place]  Date/time:  08/07/2013 06:08 PM  Clinical Social Work is seeking post-discharge placement for this patient at the following level of care:   SKILLED NURSING   (*CSW will update this form in Epic as items are completed)   08/07/2013  Patient/family provided with [facility] Department of Clinical Social Work's list of facilities offering this level of care within the geographic area requested by the patient (or if unable, by the patient's family).  08/07/2013  Patient/family informed of their freedom to choose among providers that offer the needed level of care, that participate in Medicare, Medicaid or managed care program needed by the patient, have an available bed and are willing to accept the patient.  08/07/2013  Patient/family informed of [facility]' ownership interest in [facility], as well as of the fact that they are under no obligation to receive care at this facility.  PASARR submitted to EDS on 08/07/2013 PASARR number received from EDS on 08/07/2013  FL2 transmitted to all facilities in geographic area requested by pt/family on  08/07/2013 FL2 transmitted to all facilities within larger geographic area on   Patient informed that his/her managed care company has contracts with or will negotiate with  certain facilities, including the following:     Patient/family informed of bed offers received:  08/07/2013 Patient chooses bed at [facility] Physician recommends and patient chooses bed at    Patient to be transferred to [facility] on  08/07/2013 Patient to be transferred to facility by Ambulance  The following physician request were entered in Epic:   Additional Comments: Per MD patient is ready for DC to [facility] 08/07/13. RN, patient,  son, and facility notified of DC. Son completed paperwork with facility. RN given number for report. Ambulance transport requested for patient. DC packet left with patient's chart. CSW signing off.   Roddie Mc, [place], [place], [phone]

## 2013-08-07 NOTE — Progress Notes (Signed)
Evalyn Casco Jennette to be D/C'd Skilled nursing facility [place] per MD order.  Discussed with the patient and son Casimiro Needle all questions fully answered.  Report called to Dolores Lory at 1515.    Medication List    STOP taking these medications       aspirin 81 MG tablet     metoprolol 50 MG tablet  Commonly known as:  LOPRESSOR      TAKE these medications       amitriptyline 25 MG tablet  Commonly known as:  ELAVIL  Take 75 mg by mouth at bedtime.     atorvastatin 10 MG tablet  Commonly known as:  LIPITOR  Take 10 mg by mouth daily.     CALCIUM 500 + D PO  Take 500 mg by mouth 2 (two) times daily.     ciprofloxacin 250 MG tablet  Commonly known as:  CIPRO  Take 1 tablet (250 mg total) by mouth daily. Take from 3 more days from 11/24     feeding supplement (ENSURE COMPLETE) Liqd  Take 237 mLs by mouth 2 (two) times daily between meals.     gabapentin 800 MG tablet  Commonly known as:  NEURONTIN  Take 800 mg by mouth 2 (two) times daily.     lidocaine 5 %  Commonly known as:  LIDODERM  Place 1 patch onto the skin daily. Remove & Discard patch within 12 hours or as directed by MD     midodrine 5 MG tablet  Commonly known as:  PROAMATINE  Take 1 tablet (5 mg total) by mouth 3 (three) times daily with meals.     nitroGLYCERIN 0.4 MG SL tablet  Commonly known as:  NITROSTAT  Place 0.4 mg under the tongue every 5 (five) minutes as needed for chest pain.     pramipexole 0.25 MG tablet  Commonly known as:  MIRAPEX  Take 0.25 mg by mouth at bedtime.     sertraline 100 MG tablet  Commonly known as:  ZOLOFT  Take 100 mg by mouth daily.     warfarin 5 MG tablet  Commonly known as:  COUMADIN  Take 5 mg by mouth daily at 6 PM. Take 1 tablet daily except take a 0.5 tablet on Mon, Wed, Fri        VVS, Skin clean, dry and intact without evidence of skin break down, no evidence of skin tears noted. IV catheter discontinued intact. Site without signs and symptoms of  complications. Dressing and pressure applied.  An After Visit Summary was printed and given to EMS. Patient escorted via [facility], and D/C facility EMS:.  Laurel Dimmer 08/07/2013 3:12 PM

## 2013-08-07 NOTE — Progress Notes (Signed)
ANTICOAGULATION CONSULT NOTE - Follow Up Consult  Pharmacy Consult for Coumadin Indication: History of MV, AVR  Allergies  Allergen Reactions  . Codeine    Vital Signs: Temp: 98.8 F (37.1 C) (11/24 0750) Temp src: Oral (11/24 0750) BP: 99/62 mmHg (11/24 0959) Pulse Rate: 103 (11/24 0959)  Labs:  Recent Labs  08/04/13 1629 08/05/13 0535 08/06/13 0605 08/07/13 0450  HGB 10.6* 10.1*  --   --   HCT 32.4* 31.0*  --   --   PLT 185 158  --   --   LABPROT 31.0* 35.6* 33.5* 26.1*  INR 3.12* 3.74* 3.46* 2.49*  CREATININE 1.68* 1.58*  --   --     Estimated Creatinine Clearance: 29.1 ml/min (by C-G formula based on Cr of 1.58).   Medical History: Past Medical History  Diagnosis Date  . RHEUMATIC HEART DISEASE   . HYPERTENSION   . MITRAL VALVE REPLACEMENT, HX OF   . DEPRESSION   . AORTIC VALVE REPLACEMENT, HX OF   . Headache   . Hx of vertigo   . Chronic low back pain   . GERD (gastroesophageal reflux disease)   . Osteoporosis   . Dyslipidemia   . Cerebrovascular disease     with left brain stroke, residual aphasia  . Orthostatic hypotension 02/22/2013  . Restless legs syndrome (RLS) 02/22/2013    Assessment: 72 year old female on Coumadin PTA for AVR/MVR admitted with frequent falls, finger laceration and UTI (started on Rocephin).    Dose PTA = 5 mg daily except for 2.5 mg MWF   INR on admission was therapeutic at 3.12. Home dosing regimen was resumed 11/21. INR is 2.49 today. Plan to send to SNF and f/u with INR tomorrow.  No bleeding complications noted.  Goal of Therapy:  INR goal = 2.5 to 3.5 Monitor platelets by anticoagulation protocol: Yes   Plan:   Coumadin 5mg  PO x1 if still here Daily INR  Ulyses Southward, PharmD Pager: [phone] 08/07/2013 10:41 AM

## 2013-08-07 NOTE — Discharge Summary (Addendum)
PATIENT DETAILS Name: Vanessa Bartlett Age: 72 y.o. Sex: female Date of Birth: [birth year] MRN: [id number]. Admit Date: 08/04/2013 Admitting Physician: Lynden Oxford, MD WUJ:[id number],YNWGNF DAVIDSON, MD  Recommendations for Outpatient Follow-up:  Agents started on low-dose Midodrine, still orthostatic, we have increased to 5 mg 3 times a day, his continue to optimize and perhaps add Florinef if still orthostatic in spite of maximum doses of Midodrine  Please repeat INR on 08/08/13-goal INR between 2.5-3.5-H. and is on ciprofloxacin for 3 more days from 11/24 which can interfere with the INR, therefore INR will need to be closely monitored.  Will need SLP follow up at SNF  PRIMARY DISCHARGE DIAGNOSIS:  Principal Problem:   Fall Active Problems:   HYPERTENSION   MITRAL VALVE REPLACEMENT, HX OF   AORTIC VALVE REPLACEMENT, HX OF   Aphasia   Orthostatic hypotension   CVA (cerebral infarction)      PAST MEDICAL HISTORY: Past Medical History  Diagnosis Date  . RHEUMATIC HEART DISEASE   . HYPERTENSION   . MITRAL VALVE REPLACEMENT, HX OF   . DEPRESSION   . AORTIC VALVE REPLACEMENT, HX OF   . Headache   . Hx of vertigo   . Chronic low back pain   . GERD (gastroesophageal reflux disease)   . Osteoporosis   . Dyslipidemia   . Cerebrovascular disease     with left brain stroke, residual aphasia  . Orthostatic hypotension 02/22/2013  . Restless legs syndrome (RLS) 02/22/2013    DISCHARGE MEDICATIONS:   Medication List    STOP taking these medications       aspirin 81 MG tablet     metoprolol 50 MG tablet  Commonly known as:  LOPRESSOR      TAKE these medications       amitriptyline 25 MG tablet  Commonly known as:  ELAVIL  Take 75 mg by mouth at bedtime.     atorvastatin 10 MG tablet  Commonly known as:  LIPITOR  Take 10 mg by mouth daily.     CALCIUM 500 + D PO  Take 500 mg by mouth 2 (two) times daily.     ciprofloxacin 250 MG tablet  Commonly known as:  CIPRO  Take  1 tablet (250 mg total) by mouth daily. Take from 3 more days from 11/24     feeding supplement (ENSURE COMPLETE) Liqd  Take 237 mLs by mouth 2 (two) times daily between meals.     gabapentin 800 MG tablet  Commonly known as:  NEURONTIN  Take 800 mg by mouth 2 (two) times daily.     lidocaine 5 %  Commonly known as:  LIDODERM  Place 1 patch onto the skin daily. Remove & Discard patch within 12 hours or as directed by MD     midodrine 5 MG tablet  Commonly known as:  PROAMATINE  Take 1 tablet (5 mg total) by mouth 3 (three) times daily with meals.     nitroGLYCERIN 0.4 MG SL tablet  Commonly known as:  NITROSTAT  Place 0.4 mg under the tongue every 5 (five) minutes as needed for chest pain.     pramipexole 0.25 MG tablet  Commonly known as:  MIRAPEX  Take 0.25 mg by mouth at bedtime.     sertraline 100 MG tablet  Commonly known as:  ZOLOFT  Take 100 mg by mouth daily.     warfarin 5 MG tablet  Commonly known as:  COUMADIN  Take 5 mg by mouth daily at  6 PM. Take 1 tablet daily except take a 0.5 tablet on Mon, Wed, Fri        ALLERGIES:   Allergies  Allergen Reactions  . Codeine     BRIEF HPI:  See H&P, Labs, Consult and Test reports for all details in brief, patient is a 72 year old female with a history of orthostatic hypotension, history of CVA with mild right sided weakness and dysarthria, hypertension, mechanical aortic and mitral valve replacement on chronic Coumadin therapy who has been having issues with orthostatic hypotension and frequent falls, was admitted for an episode of fall and dizziness. She was orthostatic on admission.  CONSULTATIONS:   None  PERTINENT RADIOLOGIC STUDIES: Ct Head Wo Contrast  08/04/2013   CLINICAL DATA:  Patient on Coumadin with multiple recent falls  EXAM: CT HEAD WITHOUT CONTRAST  TECHNIQUE: Contiguous axial images were obtained from the base of the skull through the vertex without intravenous contrast.  COMPARISON:  06/08/2013,  02/15/2012  FINDINGS: No hemorrhage or extra-axial fluid. No skull fracture. Encephalomalacia left parietal lobe stable. Encephalomalacia right temporal lobe stable. Mild diffuse atrophy unchanged. No evidence of mass, acute infarct, or hydrocephalus.  IMPRESSION: No acute findings.   Electronically Signed   By: Esperanza Heir M.D.   On: 08/04/2013 17:13     PERTINENT LAB RESULTS: CBC:  Recent Labs  08/04/13 1629 08/05/13 0535  WBC 7.0 4.3  HGB 10.6* 10.1*  HCT 32.4* 31.0*  PLT 185 158   CMET CMP     Component Value Date/Time   NA 142 08/05/2013 0535   K 4.3 08/05/2013 0535   CL 107 08/05/2013 0535   CO2 26 08/05/2013 0535   GLUCOSE 79 08/05/2013 0535   BUN 15 08/05/2013 0535   CREATININE 1.58* 08/05/2013 0535   CALCIUM 8.9 08/05/2013 0535   PROT 6.2 08/05/2013 0535   ALBUMIN 2.9* 08/05/2013 0535   AST 32 08/05/2013 0535   ALT 8 08/05/2013 0535   ALKPHOS 132* 08/05/2013 0535   BILITOT 0.3 08/05/2013 0535   GFRNONAA 32* 08/05/2013 0535   GFRAA 37* 08/05/2013 0535    GFR Estimated Creatinine Clearance: 29.1 ml/min (by C-G formula based on Cr of 1.58). No results found for this basename: LIPASE, AMYLASE,  in the last 72 hours No results found for this basename: CKTOTAL, CKMB, CKMBINDEX, TROPONINI,  in the last 72 hours No components found with this basename: POCBNP,  No results found for this basename: DDIMER,  in the last 72 hours No results found for this basename: HGBA1C,  in the last 72 hours No results found for this basename: CHOL, HDL, LDLCALC, TRIG, CHOLHDL, LDLDIRECT,  in the last 72 hours No results found for this basename: TSH, T4TOTAL, FREET3, T3FREE, THYROIDAB,  in the last 72 hours No results found for this basename: VITAMINB12, FOLATE, FERRITIN, TIBC, IRON, RETICCTPCT,  in the last 72 hours Coags:  Recent Labs  08/06/13 0605 08/07/13 0450  INR 3.46* 2.49*   Microbiology: Recent Results (from the past 240 hour(s))  URINE CULTURE     Status: None    Collection Time    08/04/13  5:03 PM      Result Value Range Status   Specimen Description URINE, CLEAN CATCH   Final   Special Requests NONE   Final   Culture  Setup Time     Final   Value: 08/04/2013 22:06     Performed at [facility] Count     Final   Value:  50,000 COLONIES/ML     Performed at [facility]     Final   Value: ESCHERICHIA COLI     Performed at [facility]   Report Status 08/06/2013 FINAL   Final   Organism ID, Bacteria ESCHERICHIA COLI   Final     BRIEF HOSPITAL COURSE:   Principal Problem:   Fall  - Suspect this is secondary to orthostatic hypotension, given her long-standing history of this. This may have been exacerbated by? UTI and amitriptyline.  - Was evaluated by PT -needs SNF - patient agreeable.  Active Problems:  History of orthostatic hypotension  - Likely precipitating above  - Is on Coumadin, therefore high fall risk- however has mechanical valve- therefore will need to continue.  - Since persistently orthostatic, metoprolol was discontinued on 11/22, thigh high TED hose has been placed, since still persistently orthostatic, she was started on low-dose Midodrine, since he is still orthostatic, we had increased it to 5 mg 3 times a day, this will need to be evaluated while she is in the skilled nursing facility and can be increased further if needed. If still orthostatic, she will need to perhaps also be placed on Florinef. -She wants to stay on the amitriptyline as it helps her sleep, ideally she should come off it as it may be precipitating orthostatics   UTI  - Continue with Rocephin,  urine cultures- pan sensitive Escherichia coli- will start ciprofloxacin for 3 more days from 11/24 and then stop. We need to closely monitor her INR was on Cipro.  History of mechanical mitral and aortic valve replacement- on chronic Coumadin therapy  - Continue Coumadin per pharmacy. Currently INR is therapeutic.    History of CVA  - With a residual dysarthria and mild right hemiparesis  - Cautiously continue with Coumadin, statins. Given the significant fall risk, would stop aspirin.Marland Kitchen   History of restless leg syndrome  - Continue with Requip   History of dyslipidemia  - Continue statin   History of depression  - Continue Zoloft   Dysphagia  - Seen by speech therapy-recommended for dysphagia 2 diet, patient agreeable.  TODAY-DAY OF DISCHARGE:  Subjective:   Vanessa Bartlett today has no headache,no chest abdominal pain,no new weakness tingling or numbness, continues to be orthostatic.  Objective:   Blood pressure 99/62, pulse 103, temperature 98.8 F (37.1 C), temperature source Oral, resp. rate 16, height 5\' 6"  (1.676 m), weight 55.611 kg (122 lb 9.6 oz), SpO2 96.00%.  Intake/Output Summary (Last 24 hours) at 08/07/13 1000 Last data filed at 08/06/13 1808  Gross per 24 hour  Intake     50 ml  Output      0 ml  Net     50 ml   Filed Weights   08/04/13 2139  Weight: 55.611 kg (122 lb 9.6 oz)    Exam Awake Alert, Oriented *3, No new F.N deficits, Normal affect, dysarthric at baseline  Pace.AT,PERRAL Supple Neck,No JVD, No cervical lymphadenopathy appriciated.  Symmetrical Chest wall movement, Good air movement bilaterally, CTAB RRR,No Gallops,Rubs or new Murmurs, No Parasternal Heave +ve B.Sounds, Abd Soft, Non tender, No organomegaly appriciated, No rebound -guarding or rigidity. No Cyanosis, Clubbing or edema, No new Rash or bruise  DISCHARGE CONDITION: Stable  DISPOSITION: SNF   DISCHARGE INSTRUCTIONS:    Activity:  As tolerated with Full fall precautions use walker/cane & assistance as needed Fall precaution  Diet recommendation: Dysphagia 2  diet  Discharge Orders   Future Appointments Provider Department Dept Phone   08/25/2013 3:30 PM Nilda Riggs, NP [facility] [phone]   09/19/2013 4:30 PM Lewayne Bunting, MD [facility] [place] Office [phone]   Future Orders Complete By Expires   Call MD for:  persistant dizziness or light-headedness  As directed    Call MD for:  persistant nausea and vomiting  As directed    Diet - low sodium heart healthy  As directed    Increase activity slowly  As directed       Follow-up Information   Follow up with Londell Moh, MD. Schedule an appointment as soon as possible for a visit in 2 weeks. (after discharge from SNF)    Specialty:  Internal Medicine   Contact information:   [address] [place] [address] [phone]      Total Time spent on discharge equals 45 minutes.  SignedJeoffrey Massed 08/07/2013 10:00 AM

## 2013-08-08 ENCOUNTER — Non-Acute Institutional Stay (SKILLED_NURSING_FACILITY): Payer: Medicare Other | Admitting: Adult Health

## 2013-08-08 DIAGNOSIS — F411 Generalized anxiety disorder: Secondary | ICD-10-CM

## 2013-08-08 DIAGNOSIS — I951 Orthostatic hypotension: Secondary | ICD-10-CM

## 2013-08-08 DIAGNOSIS — Z7901 Long term (current) use of anticoagulants: Secondary | ICD-10-CM

## 2013-08-08 DIAGNOSIS — F329 Major depressive disorder, single episode, unspecified: Secondary | ICD-10-CM

## 2013-08-08 DIAGNOSIS — G2581 Restless legs syndrome: Secondary | ICD-10-CM

## 2013-08-08 DIAGNOSIS — F419 Anxiety disorder, unspecified: Secondary | ICD-10-CM

## 2013-08-08 DIAGNOSIS — Z8673 Personal history of transient ischemic attack (TIA), and cerebral infarction without residual deficits: Secondary | ICD-10-CM

## 2013-08-08 DIAGNOSIS — E785 Hyperlipidemia, unspecified: Secondary | ICD-10-CM

## 2013-08-09 ENCOUNTER — Other Ambulatory Visit: Payer: Self-pay

## 2013-08-09 ENCOUNTER — Encounter (HOSPITAL_COMMUNITY): Payer: Self-pay | Admitting: Emergency Medicine

## 2013-08-09 ENCOUNTER — Emergency Department (HOSPITAL_COMMUNITY): Payer: Medicare Other

## 2013-08-09 ENCOUNTER — Non-Acute Institutional Stay (SKILLED_NURSING_FACILITY): Payer: Medicare Other | Admitting: Internal Medicine

## 2013-08-09 ENCOUNTER — Inpatient Hospital Stay (HOSPITAL_COMMUNITY)
Admission: EM | Admit: 2013-08-09 | Discharge: 2013-08-13 | DRG: 070 | Disposition: A | Discharge status: Home Health | Payer: Medicare Other | Attending: Internal Medicine | Admitting: Internal Medicine

## 2013-08-09 DIAGNOSIS — I10 Essential (primary) hypertension: Secondary | ICD-10-CM

## 2013-08-09 DIAGNOSIS — G9349 Other encephalopathy: Principal | ICD-10-CM | POA: Diagnosis present

## 2013-08-09 DIAGNOSIS — M810 Age-related osteoporosis without current pathological fracture: Secondary | ICD-10-CM | POA: Diagnosis present

## 2013-08-09 DIAGNOSIS — M545 Low back pain, unspecified: Secondary | ICD-10-CM | POA: Diagnosis present

## 2013-08-09 DIAGNOSIS — Z800 Family history of malignant neoplasm of digestive organs: Secondary | ICD-10-CM

## 2013-08-09 DIAGNOSIS — Z8051 Family history of malignant neoplasm of kidney: Secondary | ICD-10-CM

## 2013-08-09 DIAGNOSIS — E43 Unspecified severe protein-calorie malnutrition: Secondary | ICD-10-CM | POA: Diagnosis present

## 2013-08-09 DIAGNOSIS — Z79899 Other long term (current) drug therapy: Secondary | ICD-10-CM

## 2013-08-09 DIAGNOSIS — I951 Orthostatic hypotension: Secondary | ICD-10-CM | POA: Diagnosis present

## 2013-08-09 DIAGNOSIS — Z9849 Cataract extraction status, unspecified eye: Secondary | ICD-10-CM

## 2013-08-09 DIAGNOSIS — R072 Precordial pain: Secondary | ICD-10-CM

## 2013-08-09 DIAGNOSIS — F3289 Other specified depressive episodes: Secondary | ICD-10-CM | POA: Diagnosis present

## 2013-08-09 DIAGNOSIS — G609 Hereditary and idiopathic neuropathy, unspecified: Secondary | ICD-10-CM

## 2013-08-09 DIAGNOSIS — R471 Dysarthria and anarthria: Secondary | ICD-10-CM | POA: Diagnosis present

## 2013-08-09 DIAGNOSIS — Z954 Presence of other heart-valve replacement: Secondary | ICD-10-CM

## 2013-08-09 DIAGNOSIS — Z9889 Other specified postprocedural states: Secondary | ICD-10-CM

## 2013-08-09 DIAGNOSIS — R29898 Other symptoms and signs involving the musculoskeletal system: Secondary | ICD-10-CM | POA: Diagnosis present

## 2013-08-09 DIAGNOSIS — I129 Hypertensive chronic kidney disease with stage 1 through stage 4 chronic kidney disease, or unspecified chronic kidney disease: Secondary | ICD-10-CM | POA: Diagnosis present

## 2013-08-09 DIAGNOSIS — Z801 Family history of malignant neoplasm of trachea, bronchus and lung: Secondary | ICD-10-CM

## 2013-08-09 DIAGNOSIS — E860 Dehydration: Secondary | ICD-10-CM | POA: Diagnosis present

## 2013-08-09 DIAGNOSIS — IMO0002 Reserved for concepts with insufficient information to code with codable children: Secondary | ICD-10-CM | POA: Diagnosis present

## 2013-08-09 DIAGNOSIS — N390 Urinary tract infection, site not specified: Secondary | ICD-10-CM | POA: Diagnosis present

## 2013-08-09 DIAGNOSIS — I099 Rheumatic heart disease, unspecified: Secondary | ICD-10-CM

## 2013-08-09 DIAGNOSIS — R4701 Aphasia: Secondary | ICD-10-CM

## 2013-08-09 DIAGNOSIS — N184 Chronic kidney disease, stage 4 (severe): Secondary | ICD-10-CM | POA: Diagnosis present

## 2013-08-09 DIAGNOSIS — G9340 Encephalopathy, unspecified: Secondary | ICD-10-CM | POA: Diagnosis present

## 2013-08-09 DIAGNOSIS — R4182 Altered mental status, unspecified: Secondary | ICD-10-CM | POA: Diagnosis present

## 2013-08-09 DIAGNOSIS — Z8249 Family history of ischemic heart disease and other diseases of the circulatory system: Secondary | ICD-10-CM

## 2013-08-09 DIAGNOSIS — I4891 Unspecified atrial fibrillation: Secondary | ICD-10-CM | POA: Diagnosis present

## 2013-08-09 DIAGNOSIS — Z7901 Long term (current) use of anticoagulants: Secondary | ICD-10-CM

## 2013-08-09 DIAGNOSIS — I679 Cerebrovascular disease, unspecified: Secondary | ICD-10-CM

## 2013-08-09 DIAGNOSIS — I498 Other specified cardiac arrhythmias: Secondary | ICD-10-CM | POA: Diagnosis present

## 2013-08-09 DIAGNOSIS — E162 Hypoglycemia, unspecified: Secondary | ICD-10-CM | POA: Diagnosis present

## 2013-08-09 DIAGNOSIS — E785 Hyperlipidemia, unspecified: Secondary | ICD-10-CM | POA: Diagnosis present

## 2013-08-09 DIAGNOSIS — I639 Cerebral infarction, unspecified: Secondary | ICD-10-CM | POA: Diagnosis present

## 2013-08-09 DIAGNOSIS — Z833 Family history of diabetes mellitus: Secondary | ICD-10-CM

## 2013-08-09 DIAGNOSIS — G2581 Restless legs syndrome: Secondary | ICD-10-CM | POA: Diagnosis present

## 2013-08-09 DIAGNOSIS — Z9089 Acquired absence of other organs: Secondary | ICD-10-CM

## 2013-08-09 DIAGNOSIS — G8929 Other chronic pain: Secondary | ICD-10-CM | POA: Diagnosis present

## 2013-08-09 DIAGNOSIS — Z515 Encounter for palliative care: Secondary | ICD-10-CM

## 2013-08-09 DIAGNOSIS — N179 Acute kidney failure, unspecified: Secondary | ICD-10-CM | POA: Diagnosis present

## 2013-08-09 DIAGNOSIS — Z9851 Tubal ligation status: Secondary | ICD-10-CM

## 2013-08-09 DIAGNOSIS — R209 Unspecified disturbances of skin sensation: Secondary | ICD-10-CM

## 2013-08-09 DIAGNOSIS — F329 Major depressive disorder, single episode, unspecified: Secondary | ICD-10-CM

## 2013-08-09 DIAGNOSIS — R1310 Dysphagia, unspecified: Secondary | ICD-10-CM | POA: Diagnosis present

## 2013-08-09 DIAGNOSIS — F17200 Nicotine dependence, unspecified, uncomplicated: Secondary | ICD-10-CM | POA: Diagnosis present

## 2013-08-09 DIAGNOSIS — R443 Hallucinations, unspecified: Secondary | ICD-10-CM | POA: Diagnosis present

## 2013-08-09 DIAGNOSIS — I6350 Cerebral infarction due to unspecified occlusion or stenosis of unspecified cerebral artery: Secondary | ICD-10-CM

## 2013-08-09 DIAGNOSIS — R42 Dizziness and giddiness: Secondary | ICD-10-CM

## 2013-08-09 DIAGNOSIS — M47817 Spondylosis without myelopathy or radiculopathy, lumbosacral region: Secondary | ICD-10-CM

## 2013-08-09 DIAGNOSIS — Z803 Family history of malignant neoplasm of breast: Secondary | ICD-10-CM

## 2013-08-09 DIAGNOSIS — R410 Disorientation, unspecified: Secondary | ICD-10-CM

## 2013-08-09 DIAGNOSIS — R51 Headache: Secondary | ICD-10-CM

## 2013-08-09 DIAGNOSIS — K219 Gastro-esophageal reflux disease without esophagitis: Secondary | ICD-10-CM | POA: Diagnosis present

## 2013-08-09 DIAGNOSIS — Z66 Do not resuscitate: Secondary | ICD-10-CM

## 2013-08-09 DIAGNOSIS — I69920 Aphasia following unspecified cerebrovascular disease: Secondary | ICD-10-CM

## 2013-08-09 LAB — CBC
HCT: 32.5 % — ABNORMAL LOW (ref 36.0–46.0)
Hemoglobin: 10.6 g/dL — ABNORMAL LOW (ref 12.0–15.0)
MCH: 30.4 pg (ref 26.0–34.0)
MCHC: 32.6 g/dL (ref 30.0–36.0)
MCV: 93.1 fL (ref 78.0–100.0)
Platelets: 183 10*3/uL (ref 150–400)
RBC: 3.49 MIL/uL — ABNORMAL LOW (ref 3.87–5.11)
RDW: 14.2 % (ref 11.5–15.5)
WBC: 10.7 10*3/uL — ABNORMAL HIGH (ref 4.0–10.5)

## 2013-08-09 LAB — URINALYSIS, ROUTINE W REFLEX MICROSCOPIC
Bilirubin Urine: NEGATIVE
Glucose, UA: NEGATIVE mg/dL
Ketones, ur: 15 mg/dL — AB
Leukocytes, UA: NEGATIVE
Nitrite: NEGATIVE
Protein, ur: NEGATIVE mg/dL
Specific Gravity, Urine: 1.018 (ref 1.005–1.030)
Urobilinogen, UA: 0.2 mg/dL (ref 0.0–1.0)
pH: 5.5 (ref 5.0–8.0)

## 2013-08-09 LAB — POCT I-STAT 3, ART BLOOD GAS (G3+)
Bicarbonate: 21.4 mEq/L (ref 20.0–24.0)
O2 Saturation: 95 %
Patient temperature: 99.6
TCO2: 22 mmol/L (ref 0–100)
pH, Arterial: 7.419 (ref 7.350–7.450)

## 2013-08-09 LAB — COMPREHENSIVE METABOLIC PANEL
ALT: 23 U/L (ref 0–35)
BUN: 28 mg/dL — ABNORMAL HIGH (ref 6–23)
CO2: 19 mEq/L (ref 19–32)
Calcium: 9.7 mg/dL (ref 8.4–10.5)
Creatinine, Ser: 1.78 mg/dL — ABNORMAL HIGH (ref 0.50–1.10)
GFR calc Af Amer: 32 mL/min — ABNORMAL LOW (ref >90)
GFR calc non Af Amer: 28 mL/min — ABNORMAL LOW (ref >90)
Glucose, Bld: 75 mg/dL (ref 70–99)
Sodium: 143 mEq/L (ref 135–145)

## 2013-08-09 LAB — RAPID URINE DRUG SCREEN, HOSP PERFORMED
Amphetamines: NOT DETECTED
Barbiturates: NOT DETECTED
Benzodiazepines: NOT DETECTED
Cocaine: NOT DETECTED
Tetrahydrocannabinol: NOT DETECTED

## 2013-08-09 LAB — URINE MICROSCOPIC-ADD ON

## 2013-08-09 LAB — PROTIME-INR: INR: 3.28 — ABNORMAL HIGH (ref 0.00–1.49)

## 2013-08-09 LAB — ETHANOL: Alcohol, Ethyl (B): <11 mg/dL (ref 0–11)

## 2013-08-09 LAB — AMMONIA: Ammonia: 18 umol/L (ref 11–60)

## 2013-08-09 MED ORDER — LORAZEPAM 2 MG/ML IJ SOLN
1.0000 mg | Freq: Once | INTRAMUSCULAR | Status: AC
  Administered 2013-08-09: 1 mg via INTRAVENOUS
  Filled 2013-08-09: qty 1

## 2013-08-09 MED ORDER — SODIUM CHLORIDE 0.9 % IV SOLN
1000.0000 mL | Freq: Once | INTRAVENOUS | Status: AC
  Administered 2013-08-09: 1000 mL via INTRAVENOUS

## 2013-08-09 NOTE — ED Provider Notes (Signed)
CSN: [id number]     Arrival date & time 08/09/13  1652 History   First MD Initiated Contact with Patient 08/09/13 1706    Level V caveat secondary to altered mental status son is at bedside and is the Chief Complaint  Patient presents with  . Altered Mental Status   (Consider location/radiation/quality/duration/timing/severity/associated sxs/prior Treatment) HPI This is a 72 year old female who was recently discharged from this institution and sent to a skilled nursing facility after admission for postural hypotension with syncope and probable urinary tract infection. She has a history of CVA in the past. Her son is the historian as she is altered here. He reports that she was improving and was having conversations over the weekend. Monday she became confused and combative. She began wandering from the nursing home and required placement of a ankle bracelet for tracking. He reports that she has not been eating or drinking. She has not complained of anything however. He feels that she appears to be dehydrated. Previous to this past admission she had been living at home independently although she did not drive. She has had a history of multiple strokes in the past with her first stroke being at age 6. He reports that she has had multiple valves replaced area and Past Medical History  Diagnosis Date  . RHEUMATIC HEART DISEASE   . HYPERTENSION   . MITRAL VALVE REPLACEMENT, HX OF   . DEPRESSION   . AORTIC VALVE REPLACEMENT, HX OF   . Headache   . Hx of vertigo   . Chronic low back pain   . GERD (gastroesophageal reflux disease)   . Osteoporosis   . Dyslipidemia   . Cerebrovascular disease     with left brain stroke, residual aphasia  . Orthostatic hypotension 02/22/2013  . Restless legs syndrome (RLS) 02/22/2013   Past Surgical History  Procedure Laterality Date  . Aortic valve replacement    . Mitral valve replacement 1990    . Lumbar disc surgery    . Arthroscopic  knee  surgery    .  Cholecystectomy    . Dilation and curettage of uterus    . Tubal ligation    . Right knee surgery in 1993.    . Abdominal hysterectomy    . Cataract extraction Bilateral   . Tonsillectomy    . Appendectomy     Family History  Problem Relation Age of Onset  . Cancer    . Heart attack    . Diabetes    . Cancer Mother     esophageal cancer  . Cancer Father     Throat cancer  . Diabetes Sister     Kidney Cancer  . Heart disease Brother     Lung Cancer  . Aneurysm Brother     Abdominal aortic aneurysm  . Cancer Brother     lung  . Cancer Sister     Breast cancer   History  Substance Use Topics  . Smoking status: Current Every Day Smoker -- 0.30 packs/day  . Smokeless tobacco: Never Used  . Alcohol Use: No   OB History   Grav Para Term Preterm Abortions TAB SAB Ect Mult Living                 Review of Systems  Unable to perform ROS   Allergies  Codeine  Home Medications   Current Outpatient Rx  Name  Route  Sig  Dispense  Refill  . amitriptyline (ELAVIL) 25 MG tablet  Oral   Take 75 mg by mouth at bedtime.         Marland Kitchen atorvastatin (LIPITOR) 10 MG tablet   Oral   Take 10 mg by mouth daily.         . Calcium Carbonate-Vitamin D (CALCIUM 500 + D PO)   Oral   Take 500 mg by mouth 2 (two) times daily.         . ciprofloxacin (CIPRO) 250 MG tablet   Oral   Take 1 tablet (250 mg total) by mouth daily. Take from 3 more days from 11/24         . feeding supplement, ENSURE COMPLETE, (ENSURE COMPLETE) LIQD   Oral   Take 237 mLs by mouth 2 (two) times daily between meals.         . gabapentin (NEURONTIN) 800 MG tablet   Oral   Take 800 mg by mouth 2 (two) times daily.         Marland Kitchen lidocaine (LIDODERM) 5 %   Transdermal   Place 1 patch onto the skin daily. Remove & Discard patch within 12 hours or as directed by MD   30 patch   0   . LORazepam (ATIVAN) 0.5 MG tablet   Oral   Take 0.5 mg by mouth 4 (four) times daily as needed for anxiety.          . midodrine (PROAMATINE) 5 MG tablet   Oral   Take 1 tablet (5 mg total) by mouth 3 (three) times daily with meals.         . nitroGLYCERIN (NITROSTAT) 0.4 MG SL tablet   Sublingual   Place 0.4 mg under the tongue every 5 (five) minutes as needed for chest pain.          . pramipexole (MIRAPEX) 0.25 MG tablet   Oral   Take 0.25 mg by mouth at bedtime.         . sertraline (ZOLOFT) 100 MG tablet   Oral   Take 100 mg by mouth daily.         Marland Kitchen warfarin (COUMADIN) 2.5 MG tablet   Oral   Take 2.5 mg by mouth See admin instructions. Pt takes one daily on Mon, Wed, Fri         . warfarin (COUMADIN) 5 MG tablet   Oral   Take 5 mg by mouth See admin instructions. Pt takes on Tues Thurs, Sat, Sun          BP 130/91  Pulse 111  Temp(Src) 99.6 F (37.6 C) (Rectal)  Resp 21  SpO2 98% Physical Exam  Nursing note and vitals reviewed. Constitutional: She appears well-developed and well-nourished.  HENT:  Head: Normocephalic and atraumatic.  Right Ear: External ear normal.  Left Ear: External ear normal.  Mucous membranes appear very dry I am unable to examine pharynx as patient is not cooperative  Eyes: Pupils are equal, round, and reactive to light.  Neck: Normal range of motion. Neck supple.  Cardiovascular: Tachycardia present.   Pulmonary/Chest: Effort normal.  Abdominal: Soft. Normal appearance and bowel sounds are normal. There is generalized tenderness.  Musculoskeletal:  Patient appears removing all 4 extremities with some writhing movements. Her right lower stroke he appears a bit less than her other extremities.  Neurological: She is alert.  She is not oriented to person place or time. She is striking out at any time should and does not respond to my verbal commands however does appear to  respond some to her son's  Skin: Skin is warm and dry. No rash noted.    ED Course  Procedures (including critical care time) Labs Review Labs Reviewed  CBC -  Abnormal; Notable for the following:    WBC 10.7 (*)    RBC 3.49 (*)    Hemoglobin 10.6 (*)    HCT 32.5 (*)    All other components within normal limits  COMPREHENSIVE METABOLIC PANEL - Abnormal; Notable for the following:    BUN 28 (*)    Creatinine, Ser 1.78 (*)    Albumin 3.4 (*)    AST 139 (*)    Alkaline Phosphatase 135 (*)    GFR calc non Af Amer 28 (*)    GFR calc Af Amer 32 (*)    All other components within normal limits  PROTIME-INR - Abnormal; Notable for the following:    Prothrombin Time 32.2 (*)    INR 3.28 (*)    All other components within normal limits  POCT I-STAT 3, BLOOD GAS (G3+) - Abnormal; Notable for the following:    pCO2 arterial 33.2 (*)    pO2, Arterial 77.0 (*)    All other components within normal limits  CULTURE, BLOOD (SINGLE)  AMMONIA  ETHANOL  LACTIC ACID, PLASMA  URINALYSIS, ROUTINE W REFLEX MICROSCOPIC  URINE RAPID DRUG SCREEN (HOSP PERFORMED)   Imaging Review No results found.  EKG Interpretation   None       MDM  No diagnosis found. Given the patient's acute mental status changes we will provide IV hydration and sedation and attempt to obtain a CT of her brain.  This 72 year old female with history of CVA and aphasia who was recently discharged after hospitalization from fall do to orthostatic hypotension and UTI presents today with agitation and change in mental status. I do not find any evidence of acute infectious process, metabolic abnormalities, or acute intracranial process. She will require rehospitalization for further evaluation of his change in mental status and neurology consultation. Her oldest is Dr. Anne Hahn.  Hilario Quarry, MD 08/09/13 [phone]

## 2013-08-09 NOTE — ED Notes (Signed)
Per EMS, pt d/c from hospital Monday with UTI. Pt given abx and took last dose this am. Pt coming from nursing facility with increasing altered mental status. Per nursing facility, pt unable to urinate Tuesday night. Gradually became uncooperative, unable to take meds.

## 2013-08-09 NOTE — ED Notes (Signed)
Patient transported to CT 

## 2013-08-10 ENCOUNTER — Encounter (HOSPITAL_COMMUNITY): Payer: Self-pay | Admitting: Internal Medicine

## 2013-08-10 DIAGNOSIS — R4182 Altered mental status, unspecified: Secondary | ICD-10-CM | POA: Diagnosis present

## 2013-08-10 DIAGNOSIS — R471 Dysarthria and anarthria: Secondary | ICD-10-CM

## 2013-08-10 DIAGNOSIS — G9340 Encephalopathy, unspecified: Secondary | ICD-10-CM | POA: Diagnosis present

## 2013-08-10 DIAGNOSIS — R1310 Dysphagia, unspecified: Secondary | ICD-10-CM

## 2013-08-10 DIAGNOSIS — Z66 Do not resuscitate: Secondary | ICD-10-CM

## 2013-08-10 DIAGNOSIS — I951 Orthostatic hypotension: Secondary | ICD-10-CM

## 2013-08-10 DIAGNOSIS — R42 Dizziness and giddiness: Secondary | ICD-10-CM

## 2013-08-10 DIAGNOSIS — I6350 Cerebral infarction due to unspecified occlusion or stenosis of unspecified cerebral artery: Secondary | ICD-10-CM

## 2013-08-10 LAB — GLUCOSE, CAPILLARY
Glucose-Capillary: 147 mg/dL — ABNORMAL HIGH (ref 70–99)
Glucose-Capillary: 149 mg/dL — ABNORMAL HIGH (ref 70–99)

## 2013-08-10 LAB — COMPREHENSIVE METABOLIC PANEL
ALT: 22 U/L (ref 0–35)
Albumin: 2.9 g/dL — ABNORMAL LOW (ref 3.5–5.2)
Alkaline Phosphatase: 113 U/L (ref 39–117)
BUN: 24 mg/dL — ABNORMAL HIGH (ref 6–23)
GFR calc Af Amer: 40 mL/min — ABNORMAL LOW (ref >90)
Potassium: 3.5 mEq/L (ref 3.5–5.1)
Sodium: 144 mEq/L (ref 135–145)
Total Protein: 6.2 g/dL (ref 6.0–8.3)

## 2013-08-10 LAB — MRSA PCR SCREENING: MRSA by PCR: NEGATIVE

## 2013-08-10 LAB — LIPID PANEL
HDL: 69 mg/dL (ref >39)
LDL Cholesterol: 48 mg/dL (ref 0–99)
Total CHOL/HDL Ratio: 1.9 RATIO
VLDL: 17 mg/dL (ref 0–40)

## 2013-08-10 LAB — CK: Total CK: 1447 U/L — ABNORMAL HIGH (ref 7–177)

## 2013-08-10 LAB — HEMOGLOBIN A1C: Hgb A1c MFr Bld: 5.1 % (ref <5.7)

## 2013-08-10 LAB — PROTIME-INR: INR: 3.95 — ABNORMAL HIGH (ref 0.00–1.49)

## 2013-08-10 MED ORDER — ATORVASTATIN CALCIUM 10 MG PO TABS
10.0000 mg | ORAL_TABLET | Freq: Every day | ORAL | Status: DC
  Filled 2013-08-10: qty 1

## 2013-08-10 MED ORDER — DEXTROSE 5 % IV SOLN
1.0000 g | INTRAVENOUS | Status: DC
  Administered 2013-08-10 – 2013-08-12 (×3): 1 g via INTRAVENOUS
  Filled 2013-08-10 (×4): qty 10

## 2013-08-10 MED ORDER — SODIUM CHLORIDE 0.9 % IV SOLN
INTRAVENOUS | Status: DC
  Administered 2013-08-10: 02:00:00 via INTRAVENOUS

## 2013-08-10 MED ORDER — DEXTROSE 50 % IV SOLN
INTRAVENOUS | Status: AC
  Administered 2013-08-10: 25 mL via INTRAVENOUS
  Filled 2013-08-10: qty 50

## 2013-08-10 MED ORDER — SERTRALINE HCL 100 MG PO TABS
100.0000 mg | ORAL_TABLET | Freq: Once | ORAL | Status: AC
  Administered 2013-08-10: 100 mg via ORAL
  Filled 2013-08-10: qty 1

## 2013-08-10 MED ORDER — HALOPERIDOL LACTATE 5 MG/ML IJ SOLN
2.0000 mg | Freq: Once | INTRAMUSCULAR | Status: AC
  Administered 2013-08-10: 2 mg via INTRAVENOUS
  Filled 2013-08-10: qty 0.4

## 2013-08-10 MED ORDER — DEXTROSE-NACL 5-0.9 % IV SOLN
INTRAVENOUS | Status: DC
  Administered 2013-08-10 – 2013-08-11 (×2): via INTRAVENOUS
  Filled 2013-08-10 (×3): qty 1000

## 2013-08-10 MED ORDER — PRAMIPEXOLE DIHYDROCHLORIDE 0.25 MG PO TABS
0.2500 mg | ORAL_TABLET | Freq: Every day | ORAL | Status: DC
  Filled 2013-08-10: qty 1

## 2013-08-10 MED ORDER — SERTRALINE HCL 100 MG PO TABS
100.0000 mg | ORAL_TABLET | Freq: Every day | ORAL | Status: DC
  Administered 2013-08-11 – 2013-08-13 (×3): 100 mg via ORAL
  Filled 2013-08-10 (×4): qty 1

## 2013-08-10 MED ORDER — WARFARIN - PHARMACIST DOSING INPATIENT: Freq: Every day | Status: DC

## 2013-08-10 MED ORDER — BIOTENE DRY MOUTH MT LIQD
15.0000 mL | Freq: Two times a day (BID) | OROMUCOSAL | Status: DC
  Administered 2013-08-10 – 2013-08-13 (×6): 15 mL via OROMUCOSAL

## 2013-08-10 MED ORDER — MIDODRINE HCL 5 MG PO TABS
5.0000 mg | ORAL_TABLET | Freq: Three times a day (TID) | ORAL | Status: DC
  Administered 2013-08-10 – 2013-08-13 (×9): 5 mg via ORAL
  Filled 2013-08-10 (×13): qty 1

## 2013-08-10 MED ORDER — DEXTROSE 50 % IV SOLN
25.0000 mL | Freq: Once | INTRAVENOUS | Status: AC | PRN
  Administered 2013-08-10: 25 mL via INTRAVENOUS

## 2013-08-10 MED ORDER — LORAZEPAM 2 MG/ML IJ SOLN
0.5000 mg | Freq: Once | INTRAMUSCULAR | Status: AC
  Administered 2013-08-10: 0.5 mg via INTRAVENOUS
  Filled 2013-08-10: qty 1

## 2013-08-10 MED ORDER — CIPROFLOXACIN HCL 250 MG PO TABS
250.0000 mg | ORAL_TABLET | Freq: Every day | ORAL | Status: DC
  Filled 2013-08-10: qty 1

## 2013-08-10 MED ORDER — GABAPENTIN 800 MG PO TABS
800.0000 mg | ORAL_TABLET | Freq: Two times a day (BID) | ORAL | Status: DC
  Filled 2013-08-10 (×2): qty 1

## 2013-08-10 MED ORDER — WARFARIN SODIUM 2.5 MG PO TABS: 2.5000 mg | ORAL_TABLET | ORAL | Status: DC

## 2013-08-10 MED ORDER — WARFARIN SODIUM 5 MG PO TABS
5.0000 mg | ORAL_TABLET | ORAL | Status: DC
  Filled 2013-08-10: qty 1

## 2013-08-10 MED ORDER — AMITRIPTYLINE HCL 75 MG PO TABS
75.0000 mg | ORAL_TABLET | Freq: Every day | ORAL | Status: DC
  Filled 2013-08-10: qty 1

## 2013-08-10 NOTE — H&P (Signed)
[facility] History and Physical  Vanessa Bartlett UJW:[id number] DOB: [birth year]    PCP:   Londell Moh, MD   Chief Complaint: acute confusion and aphasia.  HPI: Vanessa Bartlett is an 72 y.o. 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  She was not speaking, but was making gibberish conversation.   In the ER, she was able to say that she needed to use the bathroom one time, but mostly she was aphasic.  There were no reported of fall, fever, chills, ha, focal weakness of facial droop.  Evaluation in the ER included a normal WBC, Cr of 1.6, Hb of 10.6 g per dL.  Her EKG showed rhythm appears to be afib with RVR.  Her CXR showed no infiltrate and her CT of the head showed no acute processes.  Hospital was asked to admit her for acute confusion with aphasia.  Rewiew of Systems:  She was not able to provide a meaningful ROS.    Past Medical History  Diagnosis Date  . RHEUMATIC HEART DISEASE   . HYPERTENSION   . MITRAL VALVE REPLACEMENT, HX OF   . DEPRESSION   . AORTIC VALVE REPLACEMENT, HX OF   . Headache   . Hx of vertigo   . Chronic low back pain   . GERD (gastroesophageal reflux disease)   . Osteoporosis   . Dyslipidemia   . Cerebrovascular disease     with left brain stroke, residual aphasia  . Orthostatic hypotension 02/22/2013  . Restless legs syndrome (RLS) 02/22/2013    Past Surgical History  Procedure Laterality Date  . Aortic valve replacement    . Mitral valve replacement 1990    . Lumbar disc surgery    . Arthroscopic  knee  surgery    . Cholecystectomy    . Dilation and curettage of uterus    . Tubal  ligation    . Right knee surgery in 1993.    . Abdominal hysterectomy    . Cataract extraction Bilateral   . Tonsillectomy    . Appendectomy      Medications:  HOME MEDS: Prior to Admission medications   Medication Sig Start Date End Date Taking? Authorizing Provider  amitriptyline (ELAVIL) 25 MG tablet Take 75 mg by mouth at bedtime.   Yes Historical Provider, MD  atorvastatin (LIPITOR) 10 MG tablet Take 10 mg by mouth daily. 05/16/13  Yes Historical Provider, MD  Calcium Carbonate-Vitamin D (CALCIUM 500 + D PO) Take 500 mg by mouth 2 (two) times daily.   Yes Historical Provider, MD  ciprofloxacin (CIPRO) 250 MG tablet Take 1 tablet (250 mg total) by mouth daily. Take from 3 more days from 11/24 08/07/13  Yes Shanker Levora Dredge, MD  feeding supplement, ENSURE COMPLETE, (ENSURE COMPLETE) LIQD Take 237 mLs by mouth 2 (two) times daily between meals. 08/07/13  Yes Shanker Levora Dredge, MD  gabapentin (NEURONTIN) 800 MG tablet Take 800 mg by mouth 2 (two) times daily.   Yes Historical Provider, MD  lidocaine (LIDODERM) 5 % Place 1 patch onto the skin daily. Remove & Discard patch within 12 hours or as directed by MD 08/07/13  Yes Shanker  Levora Dredge, MD  LORazepam (ATIVAN) 0.5 MG tablet Take 0.5 mg by mouth 4 (four) times daily as needed for anxiety.   Yes Historical Provider, MD  midodrine (PROAMATINE) 5 MG tablet Take 1 tablet (5 mg total) by mouth 3 (three) times daily with meals. 08/07/13  Yes Shanker Levora Dredge, MD  nitroGLYCERIN (NITROSTAT) 0.4 MG SL tablet Place 0.4 mg under the tongue every 5 (five) minutes as needed for chest pain.    Yes Historical Provider, MD  pramipexole (MIRAPEX) 0.25 MG tablet Take 0.25 mg by mouth at bedtime.   Yes Historical Provider, MD  sertraline (ZOLOFT) 100 MG tablet Take 100 mg by mouth daily. 04/24/13  Yes Historical Provider, MD  warfarin (COUMADIN) 2.5 MG tablet Take 2.5 mg by mouth See admin instructions. Pt takes one daily on Mon, Wed, Fri   Yes Historical  Provider, MD  warfarin (COUMADIN) 5 MG tablet Take 5 mg by mouth See admin instructions. Pt takes on Tues Thurs, Sat, Sun   Yes Historical Provider, MD     Allergies:  Allergies  Allergen Reactions  . Codeine     Social History:   reports that she has been smoking.  She has never used smokeless tobacco. She reports that she does not drink alcohol or use illicit drugs.  Family History: Family History  Problem Relation Age of Onset  . Cancer    . Heart attack    . Diabetes    . Cancer Mother     esophageal cancer  . Cancer Father     Throat cancer  . Diabetes Sister     Kidney Cancer  . Heart disease Brother     Lung Cancer  . Aneurysm Brother     Abdominal aortic aneurysm  . Cancer Brother     lung  . Cancer Sister     Breast cancer     Physical Exam: Filed Vitals:   08/09/13 1731 08/09/13 1745 08/09/13 1815 08/09/13 1907  BP:  165/71 130/91 118/67  Pulse:  115 111   Temp: 99.6 F (37.6 C)     TempSrc: Rectal     Resp:  23 21 34  SpO2:  100% 98%    Blood pressure 118/67, pulse 111, temperature 99.6 F (37.6 C), temperature source Rectal, resp. rate 34, SpO2 98.00%.  GEN:  Pleasant patient lying in the stretcher in no acute distress; but not cooperating. PSYCH:  She is alert, but not conversing. Marland Kitchen HEENT: Mucous membranes pink and anicteric; PERRLA; EOM intact; no cervical lymphadenopathy nor thyromegaly or carotid bruit; no JVD; There were no stridor. Neck is very supple. Breasts:: Not examined CHEST WALL: No tenderness CHEST: Normal respiration, clear to auscultation bilaterally.  HEART: tachy, irregular.  There are mechanical heart sounds. BACK: No kyphosis or scoliosis; no CVA tenderness ABDOMEN: soft and non-tender; no masses, no organomegaly, normal abdominal bowel sounds; no pannus; no intertriginous candida. There is no rebound and no distention. Rectal Exam: Not done EXTREMITIES: No bone or joint deformity; age-appropriate arthropathy of the hands  and knees; no edema; no ulcerations.  There is no calf tenderness. Genitalia: not examined PULSES: 2+ and symmetric SKIN: Normal hydration no rash or ulceration CNS: Cranial nerves 2-12 grossly intact no focal lateralizing neurologic deficit.  Speech is nonsensical; uvula elevated DTR are normal bilaterally, cerebella exam unable to perform. barbinski is negative and she moves all extremities.  Labs on Admission:  Basic Metabolic Panel:  Recent Labs Lab 08/04/13 1629 08/05/13 0535 08/09/13 1720  NA 138 142 143  K 4.3 4.3 4.4  CL 103 107 104  CO2 23 26 19   GLUCOSE 86 79 75  BUN 17 15 28*  CREATININE 1.68* 1.58* 1.78*  CALCIUM 9.4 8.9 9.7   Liver Function Tests:  Recent Labs Lab 08/04/13 1629 08/05/13 0535 08/09/13 1720  AST 35 32 139*  ALT 10 8 23   ALKPHOS 151* 132* 135*  BILITOT 0.4 0.3 0.5  PROT 7.5 6.2 7.2  ALBUMIN 3.4* 2.9* 3.4*   No results found for this basename: LIPASE, AMYLASE,  in the last 168 hours  Recent Labs Lab 08/09/13 1958  AMMONIA 18   CBC:  Recent Labs Lab 08/04/13 1629 08/05/13 0535 08/09/13 1720  WBC 7.0 4.3 10.7*  HGB 10.6* 10.1* 10.6*  HCT 32.4* 31.0* 32.5*  MCV 90.3 91.4 93.1  PLT 185 158 183   Cardiac Enzymes: No results found for this basename: CKTOTAL, CKMB, CKMBINDEX, TROPONINI,  in the last 168 hours  CBG: No results found for this basename: GLUCAP,  in the last 168 hours   Radiological Exams on Admission: Dg Chest 1 View  08/09/2013   CLINICAL DATA:  Two-day history of altered mental status.  EXAM: CHEST - 1 VIEW  COMPARISON:  None.  FINDINGS: The lungs are well-expanded. The cardiopericardial silhouette is normal in size. The patient has undergone previous median sternotomy and reportedly aortic and mitral valve replacement. The pulmonary vascularity is not engorged. The interstitial markings are minimally prominent diffusely. There is no pleural effusion or pneumothorax. The observed portions of the bony thorax exhibit  no acute abnormality. There are deformities of the 1st ribs bilaterally.  IMPRESSION: There is no evidence of pneumonia nor definite evidence of CHF. There are mildly increased interstitial markings in both lungs but these are of uncertain etiology and duration. This signed report   Electronically Signed   By: David  [place]   On: 08/09/2013 18:59   Ct Head Wo Contrast  08/09/2013   CLINICAL DATA:  Altered mental status.  EXAM: CT HEAD WITHOUT CONTRAST  TECHNIQUE: Contiguous axial images were obtained from the base of the skull through the vertex without intravenous contrast.  COMPARISON:  08/04/2013  FINDINGS: No skull fracture is noted. Paranasal sinuses and mastoid air cells are unremarkable. Stable encephalomalacia left parietal lobe. Stable encephalomalacia and right temporal lobe. Again noted cerebral atrophy. No acute cortical infarction. No mass lesion is noted on this unenhanced scan.  IMPRESSION: No acute intracranial abnormality.  No significant change.   Electronically Signed   By: Natasha Mead M.D.   On: 08/09/2013 18:53    EKG: Independently reviewed. afib with rapid ventricular rate.   Assessment/Plan Present on Admission:  . Aphasia . Orthostatic hypotension . CVA (cerebral infarction) . Dizziness and giddiness . Restless legs syndrome (RLS) . HYPERTENSION . Altered mental status  PLAN: altered mental status with confusion and aphasia is suspicious that she may have had another CVA, likely a left hemispheric infarct.  Will do blood and urine culture to exclude an infection.  Since she had a mechanical valve replaced, will continue with her coumadin.  This essentially excluded MRI as I spoke with our radiologist.  Even if we could dx a new CVA, treatment option would still be quite limited.  Will admit her for obs.  Obtain ECHO and carotid US, as she hadn't had any recent studies.  Will obtain swallow studies.  I discussed code status with her son and her pastor who are at her  bedside, and confirmed that she is a DNR.  Will honor her wishes.  She is stable hemodynamically, and will be admitted to telemetry under [facility] service.  Thank you for allowing me to participate in her care.  Other plans as per orders.  Code Status: DNR.   Houston Siren, MD. [facility] Pager [phone] 7pm to 7am.  08/10/2013, 12:15 AM

## 2013-08-10 NOTE — Progress Notes (Signed)
ANTICOAGULATION CONSULT NOTE - Follow-up Consult  Pharmacy Consult for Coumadin Indication: AVR/MVR  Allergies  Allergen Reactions  . Codeine     Patient Measurements: Height: 5\' 6"  (167.6 cm) Weight: 112 lb 6.4 oz (50.984 kg) IBW/kg (Calculated) : 59.3  Vital Signs: Temp: 98.2 F (36.8 C) (11/27 0400) Temp src: Axillary (11/27 0400) BP: 135/52 mmHg (11/27 0600) Pulse Rate: 92 (11/27 0600)  Labs:  Recent Labs  08/09/13 1720 08/10/13 0443  HGB 10.6*  --   HCT 32.5*  --   PLT 183  --   LABPROT 32.2* 37.1*  INR 3.28* 3.95*  CREATININE 1.78*  --     Estimated Creatinine Clearance: 23.7 ml/min (by C-G formula based on Cr of 1.78).  Assessment: 72yo female was discharged 11/24 for UTI now returns from SNF w/ increasing AMS and dysuria, to continue Coumadin for AVR/MVR; admitted with therapeutic INR. Home Coumadin dose of 2.5 MWF and 5mg  TTSS. INR today supratherapeutic likely secondary to ciprofloxacin from previous admission.   Goal of Therapy:  INR 2.5-3.5   Plan:  No warfarin today. Monitor daily INR. Likely can resume home dose once INR back in therapeutic range.   Thank you, Piedad Climes, PharmD Clinical Pharmacist - Resident Pager: [phone] Pharmacy: [phone] 08/10/2013 11:21 AM

## 2013-08-10 NOTE — Consult Note (Signed)
Neurology Consultation Reason for Consult: Altered mental status and dysarthria Referring Physician: Catarina Hartshorn  CC: altered mental status  History is obtained from: Patient's son, patient  HPI: Vanessa Bartlett is a 72 y.o. female with a history of 2 strokes in the past when causing right-sided weakness and aphasia about 29 years ago as well as one in 2001 which caused her to have difficulty swallowing. She has a history of mitral valve and aortic valve replacement.  She has mild aphasia at baseline, but is able to have a conversation and is understandable. Several nights ago, the patient began having worsening slurred speech as well as altered mental status. Her son describes visual hallucinations as well as agitation. She has also had woresning right sided weakness.    LKW: Monday tpa given?: no, outside window    ROS: A 14 point ROS was performed and is negative except as noted in the HPI.  Past Medical History  Diagnosis Date  . RHEUMATIC HEART DISEASE   . HYPERTENSION   . MITRAL VALVE REPLACEMENT, HX OF   . DEPRESSION   . AORTIC VALVE REPLACEMENT, HX OF   . Headache   . Hx of vertigo   . Chronic low back pain   . GERD (gastroesophageal reflux disease)   . Osteoporosis   . Dyslipidemia   . Cerebrovascular disease     with left brain stroke, residual aphasia  . Orthostatic hypotension 02/22/2013  . Restless legs syndrome (RLS) 02/22/2013    Family History: Unable to assess secondary to patient's altered mental status.    Social History: Tob: Unable to assess secondary to patient's altered mental status.    Exam: Current vital signs: BP 118/50  Pulse 93  Temp(Src) 98.6 F (37 C) (Axillary)  Resp 16  Ht 5\' 6"  (1.676 m)  Wt 50.984 kg (112 lb 6.4 oz)  BMI 18.15 kg/m2  SpO2 94% Vital signs in last 24 hours: Temp:  [98.2 F (36.8 C)-98.6 F (37 C)] 98.6 F (37 C) (11/27 1626) Pulse Rate:  [87-103] 93 (11/27 1626) Resp:  [16-34] 16 (11/27 1626) BP:  (84-159)/(47-101) 118/50 mmHg (11/27 1626) SpO2:  [94 %-100 %] 94 % (11/27 1626) Weight:  [50.984 kg (112 lb 6.4 oz)] 50.984 kg (112 lb 6.4 oz) (11/27 0155)  General: in bed, asleep CV: RRR Mental Status: Patient awakens easily then she is alert, She follows commands briskly including multistep commands. She has severe dysarthria and appears to have an aphasia as well, though difficult to be certain given the difficulty of understanding her.  Cranial Nerves: II: Visual Fields are full. Pupils are equal, round, and reactive to light.  Discs are difficult to visualize. III,IV, VI: EOMI without ptosis or diploplia.  V: Facial sensation is less on the right VII: Facial movement is notable for right facial droop VIII: hearing is intact to voice X: Uvula elevates symmetrically XI: Shoulder shrug is symmetric. XII: tongue is midline without atrophy or fasciculations.  Motor: Tone is normal. Bulk is normal. 5/5 strength was present on the left, on the right she has 4/5 strength throughout.  Sensory: Sensation is diminished on the left Deep Tendon Reflexes: 2+ and symmetric in the biceps and patellae.  Cerebellar: No clear ataxia on left, consistent with weakness on right.  Gait: Not tested secondary to patient safety concerns.     I have reviewed labs in epic and the results pertinent to this consultation are: CMP mildly low albumin, elevated Cr UA - negative Ammonia 18  I have reviewed the images obtained:CT head old strokes in the bilateral MCA territories.   Impression: 72 yo F with artificial valves and new right sided weakness and aphasia as well as hallucinations and agitation. The description of agitation and hallucinations are concerning for delirium. It is possible that she has had a new stroke, but also possible would be that her UTI has caused a physiological stress resulting in delirium and unmasking of previous deficits.   Recommendations: 1) Repeat CT tomorrow 2)  EEG 3) Agree with TSH 4) will continue to follow   Ritta Slot, MD [facility] [phone]  If 7pm- 7am, please page neurology on call at [phone].

## 2013-08-10 NOTE — Progress Notes (Signed)
Dr. Arbutus Leas notified of ST recommendations, he stated ok to give po medications if patient is alert enough to take them.

## 2013-08-10 NOTE — ED Notes (Signed)
Pt rested well after ativan IV and decrease in room stimulation.

## 2013-08-10 NOTE — Progress Notes (Signed)
Thank you for consulting the Palliative Medicine Team at [facility] to meet your patient's and family's needs.   The reason that you asked [place] to see your patient is  For  GOC  We have scheduled your patient for a meeting: 08/11/13 130 pm  The Surrogate decision make is: Sarina Ser information: [phone]  Other family members that need to be present: at his discretion   Your patient is able/unable to participate: likely not  Additional Narrative:   Izek Corvino L. Ladona Ridgel, MD MBA The Palliative Medicine Team at [facility] Phone: [phone] Pager: [phone]

## 2013-08-10 NOTE — ED Notes (Signed)
Pt family member states pt is becoming more agitated and is requesting more ativan.

## 2013-08-10 NOTE — Evaluation (Addendum)
Clinical/Bedside Swallow Evaluation Patient Details  Name: Vanessa Bartlett MRN: [id number] Date of Birth: [birth year]  Today's Date: 08/10/2013 Time: 1100-1128 SLP Time Calculation (min): 28 min  Past Medical History:  Past Medical History  Diagnosis Date  . RHEUMATIC HEART DISEASE   . HYPERTENSION   . MITRAL VALVE REPLACEMENT, HX OF   . DEPRESSION   . AORTIC VALVE REPLACEMENT, HX OF   . Headache   . Hx of vertigo   . Chronic low back pain   . GERD (gastroesophageal reflux disease)   . Osteoporosis   . Dyslipidemia   . Cerebrovascular disease     with left brain stroke, residual aphasia  . Orthostatic hypotension 02/22/2013  . Restless legs syndrome (RLS) 02/22/2013   Past Surgical History:  Past Surgical History  Procedure Laterality Date  . Aortic valve replacement    . Mitral valve replacement 1990    . Lumbar disc surgery    . Arthroscopic  knee  surgery    . Cholecystectomy    . Dilation and curettage of uterus    . Tubal ligation    . Right knee surgery in 1993.    . Abdominal hysterectomy    . Cataract extraction Bilateral   . Tonsillectomy    . Appendectomy     HPI:  Vanessa Bartlett is an 72 y.o. 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  She was not speaking, but was making gibberish conversation.   In the ER, she was able to say that she needed to use the bathroom one time, but mostly she was aphasic.  There were no reported of fall, fever, chills, ha, focal weakness of facial droop.  Evaluation in the ER included a normal WBC, Cr of 1.6, Hb of 10.6 g per dL.  Her EKG showed rhythm appears to be afib with RVR.   Her CXR showed no infiltrate and her CT of the head showed no acute processes.  Hospital was asked to admit her for acute confusion with aphasia.   Assessment / Plan / Recommendation Clinical Impression  Pt had fading alertness as BSE progressed. No overt s/s of aspiration noted during study; however pt did need very frequent prompts to stay awake and alert. Swallow was consistently timely. Would stay awake enough to swallow one bolus and then fall back asleep. Some residue left following small bite of puree. Given current alertness level, pt is at high risk of aspiration without maximum supervision and cueing. Rx puree diet w/ thin liquids, meds via alternative means, full supervision. Do not feed unless alert. Provide small bites and sips. ST will continue to follow for diet tolerance/ advancement.    Aspiration Risk  Severe (when not alert)    Diet Recommendation Dysphagia 1 (Puree);Thin liquid   Liquid Administration via: Cup Medication Administration: Via alternative means Supervision: Full supervision/cueing for compensatory strategies Compensations: Slow rate;Small sips/bites Postural Changes and/or Swallow Maneuvers: Seated upright 90 degrees;Upright 30-60 min after meal    Other  Recommendations Oral Care Recommendations: Oral care BID   Follow Up Recommendations       Frequency and Duration min 2x/week  2 weeks  Pertinent Vitals/Pain n/a    SLP Swallow Goals     Swallow Study Prior Functional Status       General HPI: Vanessa Bartlett is an 72 y.o. 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  She was not speaking,  but was making gibberish conversation.   In the ER, she was able to say that she needed to use the bathroom one time, but mostly she was aphasic.  There were no reported of fall, fever, chills, ha, focal weakness of facial droop.  Evaluation in the ER included a normal WBC, Cr of 1.6, Hb of 10.6 g per dL.  Her EKG showed rhythm appears to be afib with RVR.  Her CXR showed no infiltrate and her CT of the head showed no acute processes.  Hospital was asked to admit her for acute confusion with aphasia. Type of Study: Bedside swallow evaluation Previous Swallow Assessment: Previous BSE on recent admission earlier this month- rx was dys 2/ thin liquids Diet Prior to this Study: NPO Temperature Spikes Noted: No Respiratory Status: Room air History of Recent Intubation: No Behavior/Cognition: Cooperative;Decreased sustained attention;Requires cueing (waxing/waning alertness) Oral Cavity - Dentition: Dentures, top;Missing dentition Self-Feeding Abilities: Total assist Patient Positioning: Upright in bed Baseline Vocal Quality: Breathy Volitional Cough: Cognitively unable to elicit Volitional Swallow: Unable to elicit    Oral/Motor/Sensory Function Overall Oral Motor/Sensory Function: Other (comment) (unable to follow commands to fully assess)   Ice Chips Ice chips: Within functional limits Presentation: Spoon   Thin Liquid Thin Liquid: Impaired Presentation: Cup Oral Phase Impairments: Reduced labial seal;Poor awareness of bolus Oral Phase Functional Implications: Left anterior spillage;Right anterior spillage    Nectar Thick Nectar Thick Liquid: Not tested   Honey Thick Honey Thick Liquid: Not tested   Puree Puree: Impaired Presentation: Spoon Oral Phase Impairments: Reduced labial seal;Poor awareness of bolus Oral Phase Functional Implications: Right anterior spillage;Left anterior spillage;Oral holding;Oral residue Pharyngeal Phase Impairments: Suspected delayed Swallow   Solid   GO     Solid: Not tested       Rebecca Eaton, Jaquarious Grey K, MA, CCC-SLP  08/10/2013,11:39 AM

## 2013-08-10 NOTE — ED Notes (Signed)
Report to [facility] on [facility].  Pt to go to floor on monitor with EMT.

## 2013-08-10 NOTE — Progress Notes (Signed)
ANTICOAGULATION CONSULT NOTE - Initial Consult  Pharmacy Consult for Coumadin Indication: AVR/MVR  Allergies  Allergen Reactions  . Codeine     Patient Measurements: Height: 5\' 6"  (167.6 cm) Weight: 112 lb 6.4 oz (50.984 kg) IBW/kg (Calculated) : 59.3  Vital Signs: Temp: 99.6 F (37.6 C) (11/26 1731) Temp src: Rectal (11/26 1731) BP: 133/54 mmHg (11/27 0155) Pulse Rate: 103 (11/27 0155)  Labs:  Recent Labs  08/07/13 0450 08/09/13 1720  HGB  --  10.6*  HCT  --  32.5*  PLT  --  183  LABPROT 26.1* 32.2*  INR 2.49* 3.28*  CREATININE  --  1.78*    Estimated Creatinine Clearance: 23.7 ml/min (by C-G formula based on Cr of 1.78).   Medical History: Past Medical History  Diagnosis Date  . RHEUMATIC HEART DISEASE   . HYPERTENSION   . MITRAL VALVE REPLACEMENT, HX OF   . DEPRESSION   . AORTIC VALVE REPLACEMENT, HX OF   . Headache   . Hx of vertigo   . Chronic low back pain   . GERD (gastroesophageal reflux disease)   . Osteoporosis   . Dyslipidemia   . Cerebrovascular disease     with left brain stroke, residual aphasia  . Orthostatic hypotension 02/22/2013  . Restless legs syndrome (RLS) 02/22/2013    Medications:  Prescriptions prior to admission  Medication Sig Dispense Refill  . amitriptyline (ELAVIL) 25 MG tablet Take 75 mg by mouth at bedtime.      Marland Kitchen atorvastatin (LIPITOR) 10 MG tablet Take 10 mg by mouth daily.      . Calcium Carbonate-Vitamin D (CALCIUM 500 + D PO) Take 500 mg by mouth 2 (two) times daily.      . ciprofloxacin (CIPRO) 250 MG tablet Take 1 tablet (250 mg total) by mouth daily. Take from 3 more days from 11/24      . feeding supplement, ENSURE COMPLETE, (ENSURE COMPLETE) LIQD Take 237 mLs by mouth 2 (two) times daily between meals.      . gabapentin (NEURONTIN) 800 MG tablet Take 800 mg by mouth 2 (two) times daily.      Marland Kitchen lidocaine (LIDODERM) 5 % Place 1 patch onto the skin daily. Remove & Discard patch within 12 hours or as directed by  MD  30 patch  0  . LORazepam (ATIVAN) 0.5 MG tablet Take 0.5 mg by mouth 4 (four) times daily as needed for anxiety.      . midodrine (PROAMATINE) 5 MG tablet Take 1 tablet (5 mg total) by mouth 3 (three) times daily with meals.      . nitroGLYCERIN (NITROSTAT) 0.4 MG SL tablet Place 0.4 mg under the tongue every 5 (five) minutes as needed for chest pain.       . pramipexole (MIRAPEX) 0.25 MG tablet Take 0.25 mg by mouth at bedtime.      . sertraline (ZOLOFT) 100 MG tablet Take 100 mg by mouth daily.      Marland Kitchen warfarin (COUMADIN) 2.5 MG tablet Take 2.5 mg by mouth See admin instructions. Pt takes one daily on Mon, Wed, Fri      . warfarin (COUMADIN) 5 MG tablet Take 5 mg by mouth See admin instructions. Pt takes on Tues Thurs, Sat, Sun       Scheduled:  . amitriptyline  75 mg Oral QHS  . atorvastatin  10 mg Oral Daily  . ciprofloxacin  250 mg Oral Daily  . gabapentin  800 mg Oral BID  .  LORazepam  0.5 mg Intravenous Once  . midodrine  5 mg Oral TID WC  . pramipexole  0.25 mg Oral QHS  . sertraline  100 mg Oral Daily  . [START ON 08/11/2013] warfarin  2.5 mg Oral Q M,W,F-1800  . warfarin  5 mg Oral Q T,Th,S,Su-1800  . Warfarin - Pharmacist Dosing Inpatient   Does not apply q1800   Infusions:  . sodium chloride      Assessment: 72yo female was discharged 11/24 for UTI now returns from SNF w/ increasing AMS and dysuria, to continue Coumadin for AVR/MVR; admitted with therapeutic INR.  Goal of Therapy:  INR 2.5-3.5   Plan:  Will continue home Coumadin dose of 2.5 MWF and 5mg  TTSS and monitor INR.  Vernard Gambles, PharmD, BCPS  08/10/2013,2:07 AM

## 2013-08-10 NOTE — Progress Notes (Signed)
Patient's CBG=68, hypoglycemic protocol initiated, because patient NPO due to failed swallow screen, Patient given 25ml of D50 IV.  Upon reassessment of CBG patient's CBG=147.  Dr. Arbutus Leas notified.

## 2013-08-10 NOTE — Progress Notes (Signed)
[facility] PROGRESS NOTE  Vanessa Bartlett WUJ:[id number] DOB: 26-Jan-1941 DOA: 08/09/2013 PCP: Londell Moh, MD  Assessment/Plan: Acute Encephalopathy -not clear if pt has had new stroke or if another etiology causing dysphasia and dysarthria -repeat UA neg for pyuria -suspect dehydration as component as BUN and Cr worse with hx of poor po intake -check B12, RBC folate -d/c gabapentin for now -d/c amitriptyline for now Dysphagia -Patient failed RN swallow eval -consult speech therapy for formal swallow eval UTI -pt had EColi last admission -pt son states she had been refusing her meds at [facility] -start ceftriaxone as this will less likely increase her INR on coumadin -she was d/ced with ciprofloxacin -Although UA did not show pyuria, she has not completed her course of antibiotics for her UTI since her discharge on 08/07/2013 Renal insufficiency/CKD stage IV -BUN and creatinine have increased when compared to 08/05/2013 -Suspect volume depletion -Continue IV fluids -Check CPK--patient is on statin Dysarthria/Dysphasia -consulted neurology -CT brain neg -INR is supratherapeutic--likely from cipro -Echocardiogram, carotid Dopplers, EEG have been ordered History of mechanical AVR/MVR -PharmD to assist with warfarin dosing Orthostatic hypotension -Continue Midrin if patient is able to take po -up with assist only Atrial Tachycardia -EKG in ED showed PAT/SVT -now back in sinus, rate controlled -Echo has been ordered -metoprolol d/ced last admit due to orthostatic hypotension -monitor closely Mild Hypoglycemia -npo currently -no hx DM -switch to D5NS -CBGs every 4 hrs Goals of Care -son is amendable to speak with palliative medicine team -I have consulted -pt is DNR Family Communication:   Spoke with son Disposition Plan:   SNF when medically stable    Antibiotics:  Ceftriaxone 08/10/13>>>    Procedures/Studies: Dg Chest 1 View  08/09/2013   CLINICAL  DATA:  Two-day history of altered mental status.  EXAM: CHEST - 1 VIEW  COMPARISON:  None.  FINDINGS: The lungs are well-expanded. The cardiopericardial silhouette is normal in size. The patient has undergone previous median sternotomy and reportedly aortic and mitral valve replacement. The pulmonary vascularity is not engorged. The interstitial markings are minimally prominent diffusely. There is no pleural effusion or pneumothorax. The observed portions of the bony thorax exhibit no acute abnormality. There are deformities of the 1st ribs bilaterally.  IMPRESSION: There is no evidence of pneumonia nor definite evidence of CHF. There are mildly increased interstitial markings in both lungs but these are of uncertain etiology and duration. This signed report   Electronically Signed   By: Jake Goodson  [place]   On: 08/09/2013 18:59   Ct Head Wo Contrast  08/09/2013   CLINICAL DATA:  Altered mental status.  EXAM: CT HEAD WITHOUT CONTRAST  TECHNIQUE: Contiguous axial images were obtained from the base of the skull through the vertex without intravenous contrast.  COMPARISON:  08/04/2013  FINDINGS: No skull fracture is noted. Paranasal sinuses and mastoid air cells are unremarkable. Stable encephalomalacia left parietal lobe. Stable encephalomalacia and right temporal lobe. Again noted cerebral atrophy. No acute cortical infarction. No mass lesion is noted on this unenhanced scan.  IMPRESSION: No acute intracranial abnormality.  No significant change.   Electronically Signed   By: Natasha Mead M.D.   On: 08/09/2013 18:53   Ct Head Wo Contrast  08/04/2013   CLINICAL DATA:  Patient on Coumadin with multiple recent falls  EXAM: CT HEAD WITHOUT CONTRAST  TECHNIQUE: Contiguous axial images were obtained from the base of the skull through the vertex without intravenous contrast.  COMPARISON:  06/08/2013, 02/15/2012  FINDINGS: No hemorrhage  or extra-axial fluid. No skull fracture. Encephalomalacia left parietal lobe stable.  Encephalomalacia right temporal lobe stable. Mild diffuse atrophy unchanged. No evidence of mass, acute infarct, or hydrocephalus.  IMPRESSION: No acute findings.   Electronically Signed   By: Esperanza Heir M.D.   On: 08/04/2013 17:13         Subjective: Patient denies any pain, shortness breath, vomiting. There is no reports of respiratory distress. She has dysphasia and dysarthria. No reports of diarrhea.  Objective: Filed Vitals:   08/10/13 0115 08/10/13 0155 08/10/13 0400 08/10/13 0600  BP: 142/76 133/54 130/62 135/52  Pulse: 97 103 92 92  Temp:   98.2 F (36.8 C)   TempSrc:   Axillary   Resp: 20 21 20    Height:  5\' 6"  (1.676 m)    Weight:  50.984 kg (112 lb 6.4 oz)    SpO2: 94% 100% 94% 97%   No intake or output data in the 24 hours ending 08/10/13 0943 Weight change:  Exam:   General:  Pt is alert, intermittenly follows commands , not in acute distress  HEENT: No icterus, No thrush, No meningismus, [place]/AT  Cardiovascular: RRR, S1/S2, no rubs, no gallops  Respiratory: CTA bilaterally, no wheezing, no crackles, no rhonchi  Abdomen: Soft/+BS, non tender, non distended, no guarding  Extremities: No edema, No lymphangitis, No petechiae, No rashes, no synovitis  Neuro: Left facial droop. Right tongue deviation. Shortness of shoulders bilateral. PERRL.  unable to visualize pharynx due to the patient's poor cooperation. Protopathic sensation intact bilaterally.  Data Reviewed: Basic Metabolic Panel:  Recent Labs Lab 08/04/13 1629 08/05/13 0535 08/09/13 1720  NA 138 142 143  K 4.3 4.3 4.4  CL 103 107 104  CO2 23 26 19   GLUCOSE 86 79 75  BUN 17 15 28*  CREATININE 1.68* 1.58* 1.78*  CALCIUM 9.4 8.9 9.7   Liver Function Tests:  Recent Labs Lab 08/04/13 1629 08/05/13 0535 08/09/13 1720  AST 35 32 139*  ALT 10 8 23   ALKPHOS 151* 132* 135*  BILITOT 0.4 0.3 0.5  PROT 7.5 6.2 7.2  ALBUMIN 3.4* 2.9* 3.4*   No results found for this basename: LIPASE,  AMYLASE,  in the last 168 hours  Recent Labs Lab 08/09/13 1958  AMMONIA 18   CBC:  Recent Labs Lab 08/04/13 1629 08/05/13 0535 08/09/13 1720  WBC 7.0 4.3 10.7*  HGB 10.6* 10.1* 10.6*  HCT 32.4* 31.0* 32.5*  MCV 90.3 91.4 93.1  PLT 185 158 183   Cardiac Enzymes: No results found for this basename: CKTOTAL, CKMB, CKMBINDEX, TROPONINI,  in the last 168 hours BNP: No components found with this basename: POCBNP,  CBG:  Recent Labs Lab 08/10/13 0747 08/10/13 0825  GLUCAP 68* 147*    Recent Results (from the past 240 hour(s))  URINE CULTURE     Status: None   Collection Time    08/04/13  5:03 PM      Result Value Range Status   Specimen Description URINE, CLEAN CATCH   Final   Special Requests NONE   Final   Culture  Setup Time     Final   Value: 08/04/2013 22:06     Performed at [facility] Count     Final   Value: 50,000 COLONIES/ML     Performed at [facility]   Culture     Final   Value: ESCHERICHIA COLI     Performed at [facility]  Report Status 08/06/2013 FINAL   Final   Organism ID, Bacteria ESCHERICHIA COLI   Final  MRSA PCR SCREENING     Status: None   Collection Time    08/10/13  2:49 AM      Result Value Range Status   MRSA by PCR NEGATIVE  NEGATIVE Final   Comment:            The GeneXpert MRSA Assay (FDA     approved for NASAL specimens     only), is one component of a     comprehensive MRSA colonization     surveillance program. It is not     intended to diagnose MRSA     infection nor to guide or     monitor treatment for     MRSA infections.     Scheduled Meds: . amitriptyline  75 mg Oral QHS  . antiseptic oral rinse  15 mL Mouth Rinse BID  . atorvastatin  10 mg Oral Daily  . cefTRIAXone (ROCEPHIN)  IV  1 g Intravenous Q24H  . midodrine  5 mg Oral TID WC  . pramipexole  0.25 mg Oral QHS  . sertraline  100 mg Oral Daily  . [START ON 08/11/2013] warfarin  2.5 mg Oral Q M,W,F-1800  . warfarin  5  mg Oral Q T,Th,S,Su-1800  . Warfarin - Pharmacist Dosing Inpatient   Does not apply q1800   Continuous Infusions: . sodium chloride 50 mL/hr at 08/10/13 0218     Jonia Oakey, DO  [facility] Pager [phone]  If 7PM-7AM, please contact night-coverage [email or web address] Password [facility] 08/10/2013, 9:43 AM   LOS: 1 day

## 2013-08-11 ENCOUNTER — Inpatient Hospital Stay (HOSPITAL_COMMUNITY): Payer: Medicare Other

## 2013-08-11 DIAGNOSIS — E43 Unspecified severe protein-calorie malnutrition: Secondary | ICD-10-CM | POA: Insufficient documentation

## 2013-08-11 DIAGNOSIS — G9340 Encephalopathy, unspecified: Secondary | ICD-10-CM

## 2013-08-11 DIAGNOSIS — I6350 Cerebral infarction due to unspecified occlusion or stenosis of unspecified cerebral artery: Secondary | ICD-10-CM

## 2013-08-11 DIAGNOSIS — Z515 Encounter for palliative care: Secondary | ICD-10-CM

## 2013-08-11 DIAGNOSIS — R404 Transient alteration of awareness: Secondary | ICD-10-CM

## 2013-08-11 DIAGNOSIS — G459 Transient cerebral ischemic attack, unspecified: Secondary | ICD-10-CM

## 2013-08-11 LAB — PROTIME-INR
INR: 2.82 — ABNORMAL HIGH (ref 0.00–1.49)
Prothrombin Time: 28.7 seconds — ABNORMAL HIGH (ref 11.6–15.2)

## 2013-08-11 LAB — GLUCOSE, CAPILLARY
Glucose-Capillary: 107 mg/dL — ABNORMAL HIGH (ref 70–99)
Glucose-Capillary: 110 mg/dL — ABNORMAL HIGH (ref 70–99)
Glucose-Capillary: 136 mg/dL — ABNORMAL HIGH (ref 70–99)
Glucose-Capillary: 172 mg/dL — ABNORMAL HIGH (ref 70–99)
Glucose-Capillary: 280 mg/dL — ABNORMAL HIGH (ref 70–99)

## 2013-08-11 LAB — COMPREHENSIVE METABOLIC PANEL
ALT: 21 U/L (ref 0–35)
AST: 101 U/L — ABNORMAL HIGH (ref 0–37)
Albumin: 2.7 g/dL — ABNORMAL LOW (ref 3.5–5.2)
Alkaline Phosphatase: 117 U/L (ref 39–117)
Chloride: 113 mEq/L — ABNORMAL HIGH (ref 96–112)
Glucose, Bld: 95 mg/dL (ref 70–99)
Potassium: 4.1 mEq/L (ref 3.5–5.1)
Sodium: 148 mEq/L — ABNORMAL HIGH (ref 135–145)
Total Protein: 6.1 g/dL (ref 6.0–8.3)

## 2013-08-11 LAB — CBC
HCT: 29.6 % — ABNORMAL LOW (ref 36.0–46.0)
Hemoglobin: 9.4 g/dL — ABNORMAL LOW (ref 12.0–15.0)
MCH: 30 pg (ref 26.0–34.0)
MCHC: 31.8 g/dL (ref 30.0–36.0)
MCV: 94.6 fL (ref 78.0–100.0)
RDW: 14.7 % (ref 11.5–15.5)

## 2013-08-11 LAB — FOLATE RBC: RBC Folate: >1000 ng/mL — ABNORMAL HIGH (ref >280)

## 2013-08-11 MED ORDER — ENSURE COMPLETE PO LIQD
237.0000 mL | Freq: Two times a day (BID) | ORAL | Status: DC
  Administered 2013-08-11 – 2013-08-12 (×2): 237 mL via ORAL

## 2013-08-11 MED ORDER — WARFARIN SODIUM 7.5 MG PO TABS
7.5000 mg | ORAL_TABLET | Freq: Once | ORAL | Status: AC
  Administered 2013-08-11: 7.5 mg via ORAL
  Filled 2013-08-11: qty 1

## 2013-08-11 MED ORDER — DEXTROSE-NACL 5-0.45 % IV SOLN
INTRAVENOUS | Status: DC
  Administered 2013-08-11: 18:00:00 via INTRAVENOUS
  Filled 2013-08-11 (×3): qty 1000

## 2013-08-11 NOTE — Progress Notes (Signed)
Utilization review completed.  

## 2013-08-11 NOTE — Progress Notes (Signed)
INITIAL NUTRITION ASSESSMENT  DOCUMENTATION CODES Per approved criteria  -Severe malnutrition in the context of chronic illness   Pt meets criteria for severe MALNUTRITION in the context of chronic illness as evidenced by 14% wt loss in 1.5 months and nutrition-focused physical exam findings.  INTERVENTION: -Ensure Complete po BID, each supplement provides 350 kcal and 13 grams of protein. -Magic cup BID between meals, each supplement provides 290 kcal and 9 grams of protein.  NUTRITION DIAGNOSIS: Inadequate oral intake related to acute confusion and aphasia as evidenced by wt loss.   Goal: Pt to meet >/= 90% of their estimated nutrition needs   Monitor:  Wt, po intake, acceptance of supplements, I/O's, labs  Reason for Assessment: MST  72 y.o. female  Admitting Dx: CVA (cerebral infarction)  ASSESSMENT: Vanessa Bartlett is an 72 y.o. 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   Pt's family reports that she has lost about 20 lbs in the past month and a half. This reflects a 14% wt loss. She says that she likes Ensure Complete and Magic Cups. Pt has a meeting scheduled with palliative care. Will be sent nutritional supplements while in the hospital.  Nutrition Focused Physical Exam:  Subcutaneous Fat:  Orbital Region: severe wasting Upper Arm Region: moderate wasting Thoracic and Lumbar Region: moderate wasting  Muscle:  Temple Region: severe wasting Clavicle Bone Region: severe wasting Clavicle and Acromion Bone Region: severe wasting Scapular Bone Region: severe wasting Dorsal Hand: severe wasting Patellar Region:  moderate wasting Anterior Thigh Region: moderate wasting Posterior Calf Region: moderate wasting  Edema: none   Height: Ht Readings from Last 1 Encounters:  08/10/13 5\' 6"  (1.676 m)    Weight: Wt Readings from Last 1 Encounters:  08/11/13 114 lb 6.4 oz (51.891 kg)    Ideal Body Weight: 59.3 kg  % Ideal Body Weight: 87%  Wt Readings from Last 10 Encounters:  08/11/13 114 lb 6.4 oz (51.891 kg)  08/04/13 122 lb 9.6 oz (55.611 kg)  05/17/13 121 lb (54.885 kg)  02/22/13 122 lb (55.339 kg)  01/29/12 133 lb (60.328 kg)  09/19/12 129 lb (58.514 kg)  08/20/11 136 lb (61.689 kg)  07/09/10 137 lb (62.143 kg)  06/20/10 67 lb (30.391 kg)    Usual Body Weight: 132 lbs  % Usual Body Weight: 86%  BMI:  Body mass index is 18.47 kg/(m^2).  Estimated Nutritional Needs: Kcal: 1300-1550 Protein: 60-70 g Fluid: >1.6 L  Skin: laceration on finger  Diet Order: Dysphagia  EDUCATION NEEDS: -No education needs identified at this time   Intake/Output Summary (Last 24 hours) at 08/11/13 1332 Last data filed at 08/11/13 0500  Gross per 24 hour  Intake      0 ml  Output    500 ml  Net   -500 ml    Last BM: last recorded 11/22   Labs:   Recent Labs Lab 08/09/13 1720 08/10/13 1035 08/11/13 0505  NA 143 144 148*  K 4.4 3.5 4.1  CL 104 107 113*  CO2 19 24 28   BUN 28* 24* 20  CREATININE 1.78* 1.48* 1.47*  CALCIUM 9.7 8.9 9.1  GLUCOSE 75 78  95    CBG (last 3)   Recent Labs  08/11/13 0406 08/11/13 0753 08/11/13 1229  GLUCAP 132* 107* 172*    Scheduled Meds: . antiseptic oral rinse  15 mL Mouth Rinse BID  . cefTRIAXone (ROCEPHIN)  IV  1 g Intravenous Q24H  . midodrine  5 mg Oral TID WC  . sertraline  100 mg Oral Daily  . warfarin  7.5 mg Oral ONCE-1800  . Warfarin - Pharmacist Dosing Inpatient   Does not apply q1800    Continuous Infusions: . dextrose 5 % and 0.9% NaCl 1,000 mL infusion 50 mL/hr at 08/11/13 1610    Past Medical History  Diagnosis Date   . RHEUMATIC HEART DISEASE   . HYPERTENSION   . MITRAL VALVE REPLACEMENT, HX OF   . DEPRESSION   . AORTIC VALVE REPLACEMENT, HX OF   . Headache   . Hx of vertigo   . Chronic low back pain   . GERD (gastroesophageal reflux disease)   . Osteoporosis   . Dyslipidemia   . Cerebrovascular disease     with left brain stroke, residual aphasia  . Orthostatic hypotension 02/22/2013  . Restless legs syndrome (RLS) 02/22/2013    Past Surgical History  Procedure Laterality Date  . Aortic valve replacement    . Mitral valve replacement 1990    . Lumbar disc surgery    . Arthroscopic  knee  surgery    . Cholecystectomy    . Dilation and curettage of uterus    . Tubal ligation    . Right knee surgery in 1993.    . Abdominal hysterectomy    . Cataract extraction Bilateral   . Tonsillectomy    . Appendectomy      Ebbie Latus RD, LDN

## 2013-08-11 NOTE — Consult Note (Signed)
Patient QM:VHQIO L Bodine      DOB: 10-31-1940      NGE:[id number]   Summary of  Goals of Care; Met with Son and will follow back to meet with patient . She currently has company.  Casimiro Needle relates that his mom had been independently living up until the last admission.  She has been loosing weight and having recurrent urinary tract infections.  She recently suffered multiple tooth fractures which may be contributing to both her weight loss and infections.  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  Patient was excessively somnolent up until today when she has been more awake and able to eat.  Casimiro Needle would like to continue to treat for cure.  He struggles with the idea of loosing her but thought she might be dying this weekend.  Ideally, now that she has improved , he would like to consider getting her medically stabling and moving her to his home for long term care.     Recommend:  1.  DNR  -  Casimiro Needle initially stated he might want CPR without intubation but after full consideration of the her current medical problems , he affirmed the DNR status .    2.  Advanced directive packet was given to review.  The Chaplain can assist with completing this when his mom is more medically stable.  3.  A MOST form copy was given for him to review and we can complete in the am.  4.  Disposition: Casimiro Needle wants to take his mom home to his apartment which might cause some strife with is partner.  We offered to help talk through the process with them both.   5.  UTI continue to treat with antibiotics  6.  Malnutrition with poor po intake- likely multifactorial.  Of particular concern, however, is her poor dentition which likely needs to be addressed in the near future as it can be a source for infection in her urine and her heart valves, not to mention will decrease her ability to consume food adequately.  7.   Would get PT to start working with her now that she is more awake.   Total Time 130 pm- 220 pm  Kalicia Dufresne L. Ladona Ridgel, MD MBA The Palliative Medicine Team at [facility] Phone: [phone] Pager: [phone]

## 2013-08-11 NOTE — Progress Notes (Signed)
[facility] PROGRESS NOTE  Vanessa Bartlett EXB:[id number] DOB: June 07, 1941 DOA: 08/09/2013 PCP: Londell Moh, MD  Assessment/Plan: Acute Encephalopathy  -not clear if pt has had new stroke or if another etiology causing dysphasia and dysarthria  -repeat UA neg for pyuria  -suspect dehydration as component as BUN and Cr worse with hx of poor po intake  -May have been also a component of ciprofloxacin which can cause hallucinations and confusion as well as residual UTI and other meds  -await EEG - RBC folate>1000 -repeat B12--pt had low B12 level on 05/17/13 -d/c gabapentin for now  -d/c amitriptyline for now  Dysphagia  -Patient failed RN swallow eval  -Discussed with speech therapy--> dysphagia 2 diet UTI  -pt had EColi last admission  -pt son states she had been refusing her meds at [facility]  -start ceftriaxone as this will less likely increase her INR on coumadin  -she was d/ced with ciprofloxacin  -Although UA did not show pyuria, she has not completed her course of antibiotics for her UTI since her discharge on 08/07/2013  Renal insufficiency/CKD stage IV  -BUN and creatinine have increased when compared to 08/05/2013  -Suspect volume depletion  -improved with  IV fluids  Elevated CPK -statin vs ?seizure -Check CPK--1447 -Repeat CPK in the morning Dysarthria/Dysphasia  -appreciate neurology  -08/09/2013 CT brain neg  -INR is supratherapeutic--likely from cipro  -Echocardiogram--pending -carotid Dopplers--neg for hemodynamic significant stenosis History of mechanical AVR/MVR  -PharmD to assist with warfarin dosing  Orthostatic hypotension  -Continue Midodrine if patient is able to take po  -up with assist only  -PT eval Atrial Tachycardia  -EKG in ED showed PAT/SVT  -now back in sinus, rate controlled  -Echo has been ordered  -metoprolol d/ced last admit due to orthostatic hypotension  -monitor closely  Mild Hypoglycemia  -resolved with increased po  intake -no hx DM  Goals of Care  -son is amendable to speak with palliative medicine team  -I have consulted  -pt is DNR  Family Communication: Spoke with son at bedside Disposition Plan: son wants to take pt home   Antibiotics:  Ceftriaxone 08/10/2013>>>    Procedures/Studies: Dg Chest 1 View  08/09/2013   CLINICAL DATA:  Two-day history of altered mental status.  EXAM: CHEST - 1 VIEW  COMPARISON:  None.  FINDINGS: The lungs are well-expanded. The cardiopericardial silhouette is normal in size. The patient has undergone previous median sternotomy and reportedly aortic and mitral valve replacement. The pulmonary vascularity is not engorged. The interstitial markings are minimally prominent diffusely. There is no pleural effusion or pneumothorax. The observed portions of the bony thorax exhibit no acute abnormality. There are deformities of the 1st ribs bilaterally.  IMPRESSION: There is no evidence of pneumonia nor definite evidence of CHF. There are mildly increased interstitial markings in both lungs but these are of uncertain etiology and duration. This signed report   Electronically Signed   By: Marvelyn Bouchillon  [place]   On: 08/09/2013 18:59   Ct Head Wo Contrast  08/09/2013   CLINICAL DATA:  Altered mental status.  EXAM: CT HEAD WITHOUT CONTRAST  TECHNIQUE: Contiguous axial images were obtained from the base of the skull through the vertex without intravenous contrast.  COMPARISON:  08/04/2013  FINDINGS: No skull fracture is noted. Paranasal sinuses and mastoid air cells are unremarkable. Stable encephalomalacia left parietal lobe. Stable encephalomalacia and right temporal lobe. Again noted cerebral atrophy. No acute cortical infarction. No mass lesion is noted on this unenhanced scan.  IMPRESSION:  No acute intracranial abnormality.  No significant change.   Electronically Signed   By: Natasha Mead M.D.   On: 08/09/2013 18:53   Ct Head Wo Contrast  08/04/2013   CLINICAL DATA:  Patient on  Coumadin with multiple recent falls  EXAM: CT HEAD WITHOUT CONTRAST  TECHNIQUE: Contiguous axial images were obtained from the base of the skull through the vertex without intravenous contrast.  COMPARISON:  06/08/2013, 02/15/2012  FINDINGS: No hemorrhage or extra-axial fluid. No skull fracture. Encephalomalacia left parietal lobe stable. Encephalomalacia right temporal lobe stable. Mild diffuse atrophy unchanged. No evidence of mass, acute infarct, or hydrocephalus.  IMPRESSION: No acute findings.   Electronically Signed   By: Esperanza Heir M.D.   On: 08/04/2013 17:13         Subjective: Patient is much more alert today. Denies any fevers, chills, chest pain, shortness breath, nausea, vomiting, diarrhea, headache, dizziness, abdominal pain, dysuria.  Objective: Filed Vitals:   08/10/13 2100 08/11/13 0015 08/11/13 0500 08/11/13 1300  BP: 111/48 103/55 132/55 127/57  Pulse: 90 81 76 79  Temp: 98.6 F (37 C) 98.2 F (36.8 C) 98.3 F (36.8 C) 97.9 F (36.6 C)  TempSrc:    Oral  Resp: 18 18 16 18   Height:      Weight:   51.891 kg (114 lb 6.4 oz)   SpO2: 92% 97% 95% 98%    Intake/Output Summary (Last 24 hours) at 08/11/13 1609 Last data filed at 08/11/13 0500  Gross per 24 hour  Intake      0 ml  Output    500 ml  Net   -500 ml   Weight change: 0.907 kg (2 lb) Exam:   General:  Pt is alert, follows commands appropriately, not in acute distress  HEENT: No icterus, No thrush,  [place]/AT  Cardiovascular: RRR, S1/S2, no rubs, no gallops  Respiratory: Bibasilar crackles. No wheezing. Good air movement.  Abdomen: Soft/+BS, non tender, non distended, no guarding  Extremities: No edema, No lymphangitis, No petechiae, No rashes, no synovitis  Data Reviewed: Basic Metabolic Panel:  Recent Labs Lab 08/04/13 1629 08/05/13 0535 08/09/13 1720 08/10/13 1035 08/11/13 0505  NA 138 142 143 144 148*  K 4.3 4.3 4.4 3.5 4.1  CL 103 107 104 107 113*  CO2 23 26 19 24 28   GLUCOSE 86  79 75 78 95  BUN 17 15 28* 24* 20  CREATININE 1.68* 1.58* 1.78* 1.48* 1.47*  CALCIUM 9.4 8.9 9.7 8.9 9.1   Liver Function Tests:  Recent Labs Lab 08/04/13 1629 08/05/13 0535 08/09/13 1720 08/10/13 1035 08/11/13 0505  AST 35 32 139* 133* 101*  ALT 10 8 23 22 21   ALKPHOS 151* 132* 135* 113 117  BILITOT 0.4 0.3 0.5 0.4 0.2*  PROT 7.5 6.2 7.2 6.2 6.1  ALBUMIN 3.4* 2.9* 3.4* 2.9* 2.7*   No results found for this basename: LIPASE, AMYLASE,  in the last 168 hours  Recent Labs Lab 08/09/13 1958  AMMONIA 18   CBC:  Recent Labs Lab 08/04/13 1629 08/05/13 0535 08/09/13 1720 08/11/13 0505  WBC 7.0 4.3 10.7* 4.3  HGB 10.6* 10.1* 10.6* 9.4*  HCT 32.4* 31.0* 32.5* 29.6*  MCV 90.3 91.4 93.1 94.6  PLT 185 158 183 173   Cardiac Enzymes:  Recent Labs Lab 08/10/13 1035  CKTOTAL 1447*   BNP: No components found with this basename: POCBNP,  CBG:  Recent Labs Lab 08/10/13 1952 08/11/13 0018 08/11/13 0406 08/11/13 0753 08/11/13 1229  GLUCAP  149* 136* 132* 107* 172*    Recent Results (from the past 240 hour(s))  URINE CULTURE     Status: None   Collection Time    08/04/13  5:03 PM      Result Value Range Status   Specimen Description URINE, CLEAN CATCH   Final   Special Requests NONE   Final   Culture  Setup Time     Final   Value: 08/04/2013 22:06     Performed at [facility] Count     Final   Value: 50,000 COLONIES/ML     Performed at [facility]   Culture     Final   Value: ESCHERICHIA COLI     Performed at [facility]   Report Status 08/06/2013 FINAL   Final   Organism ID, Bacteria ESCHERICHIA COLI   Final  CULTURE, BLOOD (SINGLE)     Status: None   Collection Time    08/09/13  7:55 PM      Result Value Range Status   Specimen Description BLOOD RIGHT ANTECUBITAL   Final   Special Requests BOTTLES DRAWN AEROBIC ONLY 10CC   Final   Culture  Setup Time     Final   Value: 08/10/2013 02:55     Performed at [facility]   Culture     Final   Value:        BLOOD CULTURE RECEIVED NO GROWTH TO DATE CULTURE WILL BE HELD FOR 5 DAYS BEFORE ISSUING A FINAL NEGATIVE REPORT     Performed at [facility]   Report Status PENDING   Incomplete  MRSA PCR SCREENING     Status: None   Collection Time    08/10/13  2:49 AM      Result Value Range Status   MRSA by PCR NEGATIVE  NEGATIVE Final   Comment:            The GeneXpert MRSA Assay (FDA     approved for NASAL specimens     only), is one component of a     comprehensive MRSA colonization     surveillance program. It is not     intended to diagnose MRSA     infection nor to guide or     monitor treatment for     MRSA infections.     Scheduled Meds: . antiseptic oral rinse  15 mL Mouth Rinse BID  . cefTRIAXone (ROCEPHIN)  IV  1 g Intravenous Q24H  . feeding supplement (ENSURE COMPLETE)  237 mL Oral BID BM  . midodrine  5 mg Oral TID WC  . sertraline  100 mg Oral Daily  . warfarin  7.5 mg Oral ONCE-1800  . Warfarin - Pharmacist Dosing Inpatient   Does not apply q1800   Continuous Infusions: . dextrose 5 % and 0.45% NaCl 1,000 mL infusion       Jaxn Chiquito, DO  [facility] Pager [phone]  If 7PM-7AM, please contact night-coverage [email or web address] Password [email or web address] 08/11/2013, 4:09 PM   LOS: 2 days

## 2013-08-11 NOTE — Procedures (Signed)
History: 72 year old female with right-sided weakness  Background: The background consists of intermixed alpha and beta activities.There is a well defined posterior dominant rhythm of 9 Hz that attenuates with eye opening. There was no sleep recorded  Photic stimulation: Physiologic driving is not performed  EEG Abnormalities: None  Clinical Interpretation: This normal EEG is recorded in the waking state. There was no seizure or seizure predisposition recorded on this study.   Ritta Slot, MD [facility] [phone]  If 7pm- 7am, please page neurology on call at [phone].

## 2013-08-11 NOTE — Care Management Note (Signed)
    Page 1 of 1   08/11/2013     2:26:39 PM   CARE MANAGEMENT NOTE 08/11/2013  Patient:  Vanessa Bartlett, Vanessa Bartlett   Account Number:  [id number]  Date Initiated:  08/11/2013  Documentation initiated by:  Donn Pierini  Subjective/Objective Assessment:   Pt admitted with new right sided weakness and aphasia as well as AMS     Action/Plan:   PTA pt was at [facility]- CSW consulted for return to SNF when medically stable   Anticipated DC Date:  08/14/2013   Anticipated DC Plan:  SKILLED NURSING FACILITY  In-house referral  Clinical Social Worker      DC Planning Services  CM consult      Choice offered to / List presented to:             Status of service:  In process, will continue to follow Medicare Important Message given?   (If response is "NO", the following Medicare IM given date fields will be blank) Date Medicare IM given:   Date Additional Medicare IM given:    Discharge Disposition:    Per UR Regulation:  Reviewed for med. necessity/level of care/duration of stay  If discussed at Long Length of Stay Meetings, dates discussed:    Comments:

## 2013-08-11 NOTE — Progress Notes (Signed)
Routine EEG completed.  

## 2013-08-11 NOTE — Progress Notes (Signed)
ANTICOAGULATION CONSULT NOTE - Follow Up Consult  Pharmacy Consult for Coumadin Indication: AVR/MVR  Allergies  Allergen Reactions  . Codeine     Patient Measurements: Height: 5\' 6"  (167.6 cm) Weight: 114 lb 6.4 oz (51.891 kg) IBW/kg (Calculated) : 59.3  Vital Signs: Temp: 98.3 F (36.8 C) (11/28 0500) BP: 132/55 mmHg (11/28 0500) Pulse Rate: 76 (11/28 0500)  Labs:  Recent Labs  08/09/13 1720 08/10/13 0443 08/10/13 1035 08/11/13 0505  HGB 10.6*  --   --  9.4*  HCT 32.5*  --   --  29.6*  PLT 183  --   --  173  LABPROT 32.2* 37.1*  --  28.7*  INR 3.28* 3.95*  --  2.82*  CREATININE 1.78*  --  1.48* 1.47*  CKTOTAL  --   --  1447*  --     Estimated Creatinine Clearance: 29.2 ml/min (by C-G formula based on Cr of 1.47).  Assessment: 72yof continues on coumadin for AVR/MVR. Admitted 11/26 with a therapeutic INR, but does not appear any dose was given on day of admission. INR then increased to 3.95 (likely due to cipro that she was on pta) and dose was held 11/27. Today INR is back within therapeutic range, but has decreased significantly to 2.8. Hgb low but stable, platelets ok, no bleeding reported. Cipro has been changed to ceftriaxone which shouldn't affect INR.  Home dose: 5mg  daily except 2.5mg  MWF  Goal of Therapy:  INR 2.5-3.5 Monitor platelets by anticoagulation protocol: Yes   Plan:  1) Coumadin 7.5mg  x 1 2) INR in AM  Fredrik Rigger 08/11/2013,9:48 AM

## 2013-08-11 NOTE — Consult Note (Signed)
Patient YN:WGNFA L Denham      DOB: 12-06-1940      OZH:[id number]     Consult Note from the Palliative Medicine Team at [facility]    Consult Requested by: Dr. Arbutus Leas     PCP: Londell Moh, MD Reason for Consultation: Goals of care    Phone Number:[phone] Related symptom recommendations Assessment of patients Current state: Patient is a 72 year old white female with a known history of stroke x2 with residual encephalomalacia. Patient had to relearn to walk and talk but has been doing so for the last 29 years. She has been living alone and her son recently noted that she's been following and not eating as well. Patient was sent to inpatient rehabilitation after an admission for a fall but became somnolent. Patient was felt to have delirium secondary to continued urinary tract infection and change in environment. At this time they're unable to identify any stroke. EEG results are pending. Goals of Care: 1.  Code Status: DO NOT RESUSCITATE   2. Scope of Treatment: At this time Casimiro Needle would like to continue to treat the treatable. He is considering taking his mother home to care for him at her as apartment. I provided a most form an advanced directive packet for him to look over for future decision-making  4. Disposition: Likely come to Michael's apartment when medically stable   3. Symptom Management:    1. Pain: Not an issue would avoid opiates as these can be can compounding her delirium 2. Bowel Regimen: Monitor stable 3. Delirium: Improving status post treatment for urinary tract infection 4. Fever: As needed Tylenol 5. Nausea/Vomiting: None present continue to monitor 6. Very poor dentition which is compromising her from an infection standpoint related to her heart valves in her urinary tract infections. Consideration in the future when stable should be made for in-hospital removal of her teeth off of Coumadin.  4. Psychosocial: Patient has had significant losses over the  year with the loss of her son to leukemia, and her spouse. Casimiro Needle is her active caregiver. She also has a daughter he does not participate in her care  5. Spiritual: Chaplaincy has been offered to her son        Patient Documents Completed or Given: Document Given Completed  Advanced Directives Pkt    MOST    DNR    Gone from My Sight    Hard Choices      Brief HPI: 72 year old white female with a history of 2 strokes greater than 29 years ago with residual encephalomalacia. Patient was able to come back and live on her home for that whole period of time. She is meds the hospital from rehabilitation with altered mental status. She's felt a urinary tract infection 20 no CVA could not be ruled out. We're asked to assist with goals of care   ROS: Patient reports no pain, nausea no vomiting she denied being confused she does have a baseline     PMH:  Past Medical History  Diagnosis Date  . RHEUMATIC HEART DISEASE   . HYPERTENSION   . MITRAL VALVE REPLACEMENT, HX OF   . DEPRESSION   . AORTIC VALVE REPLACEMENT, HX OF   . Headache   . Hx of vertigo   . Chronic low back pain   . GERD (gastroesophageal reflux disease)   . Osteoporosis   . Dyslipidemia   . Cerebrovascular disease     with left brain stroke, residual aphasia  . Orthostatic hypotension 02/22/2013  .  Restless legs syndrome (RLS) 02/22/2013     PSH: Past Surgical History  Procedure Laterality Date  . Aortic valve replacement    . Mitral valve replacement 1990    . Lumbar disc surgery    . Arthroscopic  knee  surgery    . Cholecystectomy    . Dilation and curettage of uterus    . Tubal ligation    . Right knee surgery in 1993.    . Abdominal hysterectomy    . Cataract extraction Bilateral   . Tonsillectomy    . Appendectomy     I have reviewed the FH and SH and  If appropriate update it with new information. Allergies  Allergen Reactions  . Codeine    Scheduled Meds: . antiseptic oral rinse  15 mL  Mouth Rinse BID  . cefTRIAXone (ROCEPHIN)  IV  1 g Intravenous Q24H  . feeding supplement (ENSURE COMPLETE)  237 mL Oral BID BM  . midodrine  5 mg Oral TID WC  . sertraline  100 mg Oral Daily  . warfarin  7.5 mg Oral ONCE-1800  . Warfarin - Pharmacist Dosing Inpatient   Does not apply q1800   Continuous Infusions: . dextrose 5 % and 0.9% NaCl 1,000 mL infusion 50 mL/hr at 08/11/13 0742   PRN Meds:.    BP 127/57  Pulse 79  Temp(Src) 97.9 F (36.6 C) (Oral)  Resp 18  Ht 5\' 6"  (1.676 m)  Wt 51.891 kg (114 lb 6.4 oz)  BMI 18.47 kg/m2  SpO2 98%   PPS: 40-50%   Intake/Output Summary (Last 24 hours) at 08/11/13 1421 Last data filed at 08/11/13 0500  Gross per 24 hour  Intake      0 ml  Output    500 ml  Net   -500 ml   LBM:    08/10/2013           Physical Exam:  General: No acute distress awake alert with baseline expressive issues related to previous stroke HEENT:  Pupils are equal round reactive to light, poor dentition is present she is cachectic mucous membranes are moist no JVD Chest:   Decreased but clear to auscultation there is no rhonchi rest or wheezes CVS: Valve clicks are heard positive S1 and S2 Abdomen: Scaphoid soft nontender on palpation Ext: Thin, some areas of ecchymosis no edema Neuro: Awake alert oriented to self and able to communicate some of her needs. She has a baseline expressive aphasia  Labs: CBC    Component Value Date/Time   WBC 4.3 08/11/2013 0505   RBC 3.13* 08/11/2013 0505   HGB 9.4* 08/11/2013 0505   HCT 29.6* 08/11/2013 0505   PLT 173 08/11/2013 0505   MCV 94.6 08/11/2013 0505   MCH 30.0 08/11/2013 0505   MCHC 31.8 08/11/2013 0505   RDW 14.7 08/11/2013 0505   LYMPHSABS 1.0 08/20/2011 1534   MONOABS 0.3 08/20/2011 1534   EOSABS 0.1 08/20/2011 1534   BASOSABS 0.3* 08/20/2011 1534     CMP     Component Value Date/Time   NA 148* 08/11/2013 0505   K 4.1 08/11/2013 0505   CL 113* 08/11/2013 0505   CO2 28 08/11/2013 0505    GLUCOSE 95 08/11/2013 0505   BUN 20 08/11/2013 0505   CREATININE 1.47* 08/11/2013 0505   CALCIUM 9.1 08/11/2013 0505   PROT 6.1 08/11/2013 0505   ALBUMIN 2.7* 08/11/2013 0505   AST 101* 08/11/2013 0505   ALT 21 08/11/2013 0505   ALKPHOS 117  08/11/2013 0505   BILITOT 0.2* 08/11/2013 0505   GFRNONAA 35* 08/11/2013 0505   GFRAA 41* 08/11/2013 0505    Chest Xray Reviewed/Impressions: There is no evidence of pneumonia nor definite evidence of CHF.  There are mildly increased interstitial markings in both lungs but  these are of uncertain etiology and duration. This signed report    CT scan of the Head Reviewed/Impressions: Stable severe bilateral temporal parietal, left side greater than  right, cerebral atrophy is present. No mass. No hemorrhage. No  hydrocephalus. No acute bony abnormality identified. Visualized  paranasal sinuses are clear. Orbits are unremarkable. Mastoids are  unremarkable     Time In Time Out Total Time Spent with Patient Total Overall Time  130 pm 220 pm  15 minutes    50 minutes     Greater than 50%  of this time was spent counseling and coordinating care related to the above assessment and plan.   Sanel Stemmer L. Ladona Ridgel, MD MBA The Palliative Medicine Team at [facility] Phone: [phone] Pager: [phone]

## 2013-08-11 NOTE — Progress Notes (Signed)
*  PRELIMINARY RESULTS* Vascular Ultrasound Carotid Duplex (Doppler) has been completed.   Findings suggest 1-39% internal carotid artery stenosis bilaterally. Vertebral arteries are patent with antegrade flow.  08/11/2013 11:27 AM Gertie Fey, RVT, RDCS, RDMS

## 2013-08-11 NOTE — Progress Notes (Signed)
Speech Language Pathology Treatment: Dysphagia  Patient Details Name: Vanessa Bartlett MRN: [id number] DOB: [birth year] Today's Date: 08/11/2013 Time: 4098-1191 SLP Time Calculation (min): 20 min  Assessment / Plan / Recommendation Clinical Impression  Pt observed directly with dysphagia 2 consistency and thin liquids via straw. Pt coughed immediately following 1 of 6 straw sips, which was a large sip. When cued to take small sips, no more overt s/s of aspiration were noted. Prolonged mastication observed with small bites of food. Rx upgrade to Dysphagia 2 diet, thin liquids, straws okay (and preferred by pt), full supervision to cue for small bites and sips. Speech will continue to follow for diet tolerance/ advancement.   HPI HPI: Vanessa Bartlett is an 72 y.o. 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  She was not speaking, but was making gibberish conversation.   In the ER, she was able to say that she needed to use the bathroom one time, but mostly she was aphasic.  There were no reported of fall, fever, chills, ha, focal weakness of facial droop.  Evaluation in the ER included a normal WBC, Cr of 1.6, Hb of 10.6 g per dL.  Her EKG showed rhythm appears to be afib with RVR.  Her CXR showed no infiltrate and her CT of the head showed no acute processes.  Hospital was asked to admit her for acute confusion with aphasia.   Pertinent Vitals N/A  SLP Plan  Goals updated    Recommendations Diet recommendations: Dysphagia 2 (fine chop);Thin liquid Liquids provided via: Straw Medication Administration: Whole meds with puree Supervision: Full  supervision/cueing for compensatory strategies Compensations: Slow rate;Small sips/bites;Check for pocketing;Check for anterior loss Postural Changes and/or Swallow Maneuvers: Seated upright 90 degrees;Upright 30-60 min after meal              Oral Care Recommendations: Oral care BID Plan: Goals updated    GO     Metro Kung, MA, CCC-SLP 08/11/2013, 11:31 AM

## 2013-08-12 DIAGNOSIS — I379 Nonrheumatic pulmonary valve disorder, unspecified: Secondary | ICD-10-CM

## 2013-08-12 DIAGNOSIS — R209 Unspecified disturbances of skin sensation: Secondary | ICD-10-CM

## 2013-08-12 LAB — GLUCOSE, CAPILLARY
Glucose-Capillary: 101 mg/dL — ABNORMAL HIGH (ref 70–99)
Glucose-Capillary: 130 mg/dL — ABNORMAL HIGH (ref 70–99)
Glucose-Capillary: 98 mg/dL (ref 70–99)
Glucose-Capillary: 98 mg/dL (ref 70–99)

## 2013-08-12 LAB — BASIC METABOLIC PANEL
BUN: 15 mg/dL (ref 6–23)
Calcium: 8.7 mg/dL (ref 8.4–10.5)
Creatinine, Ser: 1.26 mg/dL — ABNORMAL HIGH (ref 0.50–1.10)
GFR calc Af Amer: 49 mL/min — ABNORMAL LOW (ref >90)
GFR calc non Af Amer: 42 mL/min — ABNORMAL LOW (ref >90)
Glucose, Bld: 90 mg/dL (ref 70–99)
Sodium: 142 mEq/L (ref 135–145)

## 2013-08-12 LAB — PROTIME-INR: Prothrombin Time: 22.2 seconds — ABNORMAL HIGH (ref 11.6–15.2)

## 2013-08-12 MED ORDER — WARFARIN SODIUM 7.5 MG PO TABS
7.5000 mg | ORAL_TABLET | Freq: Once | ORAL | Status: AC
  Administered 2013-08-12: 7.5 mg via ORAL
  Filled 2013-08-12: qty 1

## 2013-08-12 NOTE — Progress Notes (Deleted)
   CARE MANAGEMENT NOTE 08/12/2013  Patient:  Vanessa Bartlett, Vanessa Bartlett   Account Number:  [id number]  Date Initiated:  08/08/2013  Documentation initiated by:  [facility]  Subjective/Objective Assessment:   72 yo female with hx of  CAD, positive stress test in the apical area, resulted in a heart cath a week ago, with RCA stenting, on Brilinta, hx of diverticulosis, hyperlipidemia, chornic low back pain, with hx of spinal stenosis/ hm w kids     Action/Plan:   transfuse PRBC, CT r/o retroperitoneal bleed//home with home health   Anticipated DC Date:  08/10/2013   Anticipated DC Plan:  HOME W HOME HEALTH SERVICES      DC Planning Services  CM consult      [facility] Choice  HOME HEALTH  Resumption Of Svcs/PTA Provider   Choice offered to / List presented to:     DME arranged  3-N-1  SHOWER STOOL      DME agency  [facility]     [facility] arranged  HH-1 RN  HH-10 DISEASE MANAGEMENT  HH-2 PT  HH-6 SOCIAL WORKER      HH agency  [facility]   Status of service:  Completed, signed off Medicare Important Message given?   (If response is "NO", the following Medicare IM given date fields will be blank) Date Medicare IM given:   Date Additional Medicare IM given:    Discharge Disposition:  HOME W HOME HEALTH SERVICES  Per UR Regulation:    If discussed at Long Length of Stay Meetings, dates discussed:    Comments:  08/12/13 16:25 MD called Cm to add DME 3n1 and shower stool per son's request, Casimiro Needle [phone].  Orders placed [facility] notified of dishcarge tomorrow 08/13/13 and adding of DME. No other CM needs were communicated, Freddy Jaksch, BSN, [place] [phone].  08/08/13 1030 [place]ellia [address], RN, BSN, [place] [phone] Pt currently active with [facility] for RN/PT/SW services.  Resumption of care requested.  Kizzie Furnish, RN of [facility] notified.  No DME needs identified at this time.

## 2013-08-12 NOTE — Progress Notes (Signed)
ANTICOAGULATION CONSULT NOTE - Follow Up Consult  Pharmacy Consult for Coumadin Indication: AVR/MVR  Allergies  Allergen Reactions  . Codeine     Patient Measurements: Height: 5\' 6"  (167.6 cm) Weight: 114 lb 6.4 oz (51.891 kg) IBW/kg (Calculated) : 59.3  Vital Signs: Temp: 98.2 F (36.8 C) (11/29 0804) Temp src: Oral (11/29 0804) BP: 118/75 mmHg (11/29 0804) Pulse Rate: 86 (11/29 0804)  Labs:  Recent Labs  08/09/13 1720 08/10/13 0443 08/10/13 1035 08/11/13 0505 08/12/13 0610  HGB 10.6*  --   --  9.4*  --   HCT 32.5*  --   --  29.6*  --   PLT 183  --   --  173  --   LABPROT 32.2* 37.1*  --  28.7* 22.2*  INR 3.28* 3.95*  --  2.82* 2.02*  CREATININE 1.78*  --  1.48* 1.47* 1.26*  CKTOTAL  --   --  1447*  --  132    Estimated Creatinine Clearance: 34 ml/min (by C-G formula based on Cr of 1.26).  Assessment: 72yof continues on coumadin for AVR/MVR. Admitted 11/26 with a therapeutic INR, but does not appear any dose was given on day of admission. INR then increased to 3.95 (likely due to cipro that she was on pta) and dose was held 11/27. Today INR dropped below therapeutic range. Hgb low but stable, platelets ok, no bleeding reported. Cipro has been changed to ceftriaxone which shouldn't affect INR.  Will give second boost of 7.5mg  today since INR decreased significantly again today.   Home dose: 5mg  daily except 2.5mg  MWF  Goal of Therapy:  INR 2.5-3.5 Monitor platelets by anticoagulation protocol: Yes   Plan:  1) Coumadin 7.5mg  x 1 2) INR in AM  Thank you, Piedad Climes, PharmD Clinical Pharmacist - Resident Pager: [phone] Pharmacy: [phone] 08/12/2013 9:31 AM

## 2013-08-12 NOTE — Progress Notes (Signed)
[facility] PROGRESS NOTE  Vanessa Bartlett AVW:[id number] DOB: 20-Dec-1940 DOA: 08/09/2013 PCP: Londell Moh, MD  Assessment/Plan: Acute Encephalopathy  -hallucinations and agitation history suggests delirium  -improved  -repeat UA neg for pyuria  -suspect dehydration as component as BUN and Cr worse with hx of poor po intake  -May have been also a component of ciprofloxacin which can cause hallucinations and confusion as well as residual UTI and other meds  -EEG--no epileptiform discharges  - RBC folate>1000  -repeat B12--pt had low B12 level on 05/17/13  -Repeat B12 is pending at the time of discharge  -d/c gabapentin and amitriptyline for now as these medications may have contributed to her encephalopathy  -Carotid ultrasound negative for hemodynamically significant lesions  Dysphagia  -Patient failed RN swallow eval  -Discussed with speech therapy--> dysphagia 2 diet  UTI  -pt had EColi last admission  -pt son states she had been refusing her meds at [facility]  -start ceftriaxone as this will less likely increase her INR on coumadin  -Although UA did not show pyuria, she has not completed her course of antibiotics for her UTI since her discharge on 08/07/2013  -The patient will complete 4 additional days of amoxicillin which would finish 7 days of therapy; patient received 3 days of ceftriaxone while in the hospital  Renal insufficiency/CKD stage IV  -BUN and creatinine have increased when compared to 08/05/2013  -Suspect volume depletion  -improved with IV fluids  -Serum creatinine 1.26 on the day of discharge  Elevated CPK  -statin vs ?seizure  -Check CPK--1447  -Repeat CPK--132 after fluids  Dysarthria/Dysphasia  -appreciate neurology  -08/09/2013 CT brain neg  -INR is supratherapeutic--likely from cipro  -Echocardiogram--EF 55-60%, no AVR or MVR leaks  -carotid Dopplers--neg for hemodynamic significant stenosis  History of mechanical AVR/MVR  -PharmD to assist  with warfarin dosing  -Discussed with the patient's son that he will need to take her to her primary care physician for INR monitoring.  Orthostatic hypotension  -Continue Midodrine if patient is able to take po  -up with assist only  -PT eval/OT eval -Discontinue gabapentin as this may also contribute to orthostatic hypotension  Atrial Tachycardia  -EKG in ED showed PAT/SVT  -now back in sinus, rate controlled  -Echo  -metoprolol d/ced last admit due to orthostatic hypotension  -monitor closely  Mild Hypoglycemia  -resolved with increased po intake  -no hx DM  Goals of Care  -son is amendable to speak with palliative medicine team  -appreciate Dr. Ladona Ridgel who met with son and clarified goals of care  -pt is DNR   Family Communication:   Updated son on phone Disposition Plan:   Home when medically stable  Antibiotics:  Ceftriaxone 08/10/2013>>>       Procedures/Studies: Dg Chest 1 View  08/09/2013   CLINICAL DATA:  Two-day history of altered mental status.  EXAM: CHEST - 1 VIEW  COMPARISON:  None.  FINDINGS: The lungs are well-expanded. The cardiopericardial silhouette is normal in size. The patient has undergone previous median sternotomy and reportedly aortic and mitral valve replacement. The pulmonary vascularity is not engorged. The interstitial markings are minimally prominent diffusely. There is no pleural effusion or pneumothorax. The observed portions of the bony thorax exhibit no acute abnormality. There are deformities of the 1st ribs bilaterally.  IMPRESSION: There is no evidence of pneumonia nor definite evidence of CHF. There are mildly increased interstitial markings in both lungs but these are of uncertain etiology and duration. This signed report  Electronically Signed   By: Ivery Nanney  [place]   On: 08/09/2013 18:59   Ct Head Wo Contrast  08/11/2013   CLINICAL DATA:  Difficulty speaking.  EXAM: CT HEAD WITHOUT CONTRAST  TECHNIQUE: Contiguous axial images were  obtained from the base of the skull through the vertex without intravenous contrast.  COMPARISON:  CT 08/09/2013.  FINDINGS: Stable severe bilateral temporal parietal, left side greater than right, cerebral atrophy is present. No mass. No hemorrhage. No hydrocephalus. No acute bony abnormality identified. Visualized paranasal sinuses are clear. Orbits are unremarkable. Mastoids are unremarkable.  IMPRESSION: Stable cerebral atrophy.  No acute abnormality.   Electronically Signed   By: Maisie Fus  Register   On: 08/11/2013 17:01   Ct Head Wo Contrast  08/09/2013   CLINICAL DATA:  Altered mental status.  EXAM: CT HEAD WITHOUT CONTRAST  TECHNIQUE: Contiguous axial images were obtained from the base of the skull through the vertex without intravenous contrast.  COMPARISON:  08/04/2013  FINDINGS: No skull fracture is noted. Paranasal sinuses and mastoid air cells are unremarkable. Stable encephalomalacia left parietal lobe. Stable encephalomalacia and right temporal lobe. Again noted cerebral atrophy. No acute cortical infarction. No mass lesion is noted on this unenhanced scan.  IMPRESSION: No acute intracranial abnormality.  No significant change.   Electronically Signed   By: Natasha Mead M.D.   On: 08/09/2013 18:53   Ct Head Wo Contrast  08/04/2013   CLINICAL DATA:  Patient on Coumadin with multiple recent falls  EXAM: CT HEAD WITHOUT CONTRAST  TECHNIQUE: Contiguous axial images were obtained from the base of the skull through the vertex without intravenous contrast.  COMPARISON:  06/08/2013, 02/15/2012  FINDINGS: No hemorrhage or extra-axial fluid. No skull fracture. Encephalomalacia left parietal lobe stable. Encephalomalacia right temporal lobe stable. Mild diffuse atrophy unchanged. No evidence of mass, acute infarct, or hydrocephalus.  IMPRESSION: No acute findings.   Electronically Signed   By: Esperanza Heir M.D.   On: 08/04/2013 17:13         Subjective: Patient is more alert this morning. She  denies any headache, dizziness, chest pain, shortness breath, nausea, vomiting, diarrhea, abdominal pain, fevers, chills.  Objective: Filed Vitals:   08/11/13 2000 08/12/13 0000 08/12/13 0400 08/12/13 0804  BP: 113/37 129/54 111/59 118/75  Pulse: 73 77 79 86  Temp: 98.3 F (36.8 C) 98.4 F (36.9 C) 98.3 F (36.8 C) 98.2 F (36.8 C)  TempSrc: Oral Oral Oral Oral  Resp: 18 18 18 18   Height:      Weight:      SpO2: 100% 98% 95% 99%   No intake or output data in the 24 hours ending 08/12/13 1410 Weight change:  Exam:   General:  Pt is alert, follows commands appropriately, not in acute distress  HEENT: No icterus, No thrush,  [place]/AT  Cardiovascular: RRR, S1/S2, no rubs, no gallops  Respiratory: CTA bilaterally, no wheezing, no crackles, no rhonchi  Abdomen: Soft/+BS, non tender, non distended, no guarding  Extremities: No edema, No lymphangitis, No petechiae, No rashes, no synovitis  Data Reviewed: Basic Metabolic Panel:  Recent Labs Lab 08/09/13 1720 08/10/13 1035 08/11/13 0505 08/12/13 0610  NA 143 144 148* 142  K 4.4 3.5 4.1 3.9  CL 104 107 113* 108  CO2 19 24 28 27   GLUCOSE 75 78 95 90  BUN 28* 24* 20 15  CREATININE 1.78* 1.48* 1.47* 1.26*  CALCIUM 9.7 8.9 9.1 8.7  MG  --   --   --  2.0   Liver Function Tests:  Recent Labs Lab 08/09/13 1720 08/10/13 1035 08/11/13 0505  AST 139* 133* 101*  ALT 23 22 21   ALKPHOS 135* 113 117  BILITOT 0.5 0.4 0.2*  PROT 7.2 6.2 6.1  ALBUMIN 3.4* 2.9* 2.7*   No results found for this basename: LIPASE, AMYLASE,  in the last 168 hours  Recent Labs Lab 08/09/13 1958  AMMONIA 18   CBC:  Recent Labs Lab 08/09/13 1720 08/11/13 0505  WBC 10.7* 4.3  HGB 10.6* 9.4*  HCT 32.5* 29.6*  MCV 93.1 94.6  PLT 183 173   Cardiac Enzymes:  Recent Labs Lab 08/10/13 1035 08/12/13 0610  CKTOTAL 1447* 132   BNP: No components found with this basename: POCBNP,  CBG:  Recent Labs Lab 08/11/13 1811 08/11/13 2051  08/12/13 0034 08/12/13 0403 08/12/13 0802  GLUCAP 110* 280* 101* 130* 98    Recent Results (from the past 240 hour(s))  URINE CULTURE     Status: None   Collection Time    08/04/13  5:03 PM      Result Value Range Status   Specimen Description URINE, CLEAN CATCH   Final   Special Requests NONE   Final   Culture  Setup Time     Final   Value: 08/04/2013 22:06     Performed at [facility] Count     Final   Value: 50,000 COLONIES/ML     Performed at [facility]   Culture     Final   Value: ESCHERICHIA COLI     Performed at [facility]   Report Status 08/06/2013 FINAL   Final   Organism ID, Bacteria ESCHERICHIA COLI   Final  CULTURE, BLOOD (SINGLE)     Status: None   Collection Time    08/09/13  7:55 PM      Result Value Range Status   Specimen Description BLOOD RIGHT ANTECUBITAL   Final   Special Requests BOTTLES DRAWN AEROBIC ONLY 10CC   Final   Culture  Setup Time     Final   Value: 08/10/2013 02:55     Performed at [facility]   Culture     Final   Value:        BLOOD CULTURE RECEIVED NO GROWTH TO DATE CULTURE WILL BE HELD FOR 5 DAYS BEFORE ISSUING A FINAL NEGATIVE REPORT     Performed at [facility]   Report Status PENDING   Incomplete  MRSA PCR SCREENING     Status: None   Collection Time    08/10/13  2:49 AM      Result Value Range Status   MRSA by PCR NEGATIVE  NEGATIVE Final   Comment:            The GeneXpert MRSA Assay (FDA     approved for NASAL specimens     only), is one component of a     comprehensive MRSA colonization     surveillance program. It is not     intended to diagnose MRSA     infection nor to guide or     monitor treatment for     MRSA infections.     Scheduled Meds: . antiseptic oral rinse  15 mL Mouth Rinse BID  . cefTRIAXone (ROCEPHIN)  IV  1 g Intravenous Q24H  . feeding supplement (ENSURE COMPLETE)  237 mL Oral BID BM  . midodrine  5 mg Oral TID WC  .  sertraline  100 mg Oral  Daily  . warfarin  7.5 mg Oral ONCE-1800  . Warfarin - Pharmacist Dosing Inpatient   Does not apply q1800   Continuous Infusions: . dextrose 5 % and 0.45% NaCl 1,000 mL infusion 50 mL/hr at 08/11/13 1821     Angelyn Osterberg, DO  [facility] Pager [phone]  If 7PM-7AM, please contact night-coverage [email or web address] Password [email or web address] 08/12/2013, 2:10 PM   LOS: 3 days

## 2013-08-12 NOTE — Progress Notes (Signed)
*  PRELIMINARY RESULTS* Echocardiogram 2D Echocardiogram has been performed.  Vanessa Bartlett 08/12/2013, 10:25 AM

## 2013-08-12 NOTE — Evaluation (Signed)
Physical Therapy Evaluation Patient Details Name: Vanessa Bartlett MRN: [id number] DOB: Apr 01, 1941 Today's Date: 08/12/2013 Time: 1011-1042 PT Time Calculation (min): 31 min  PT Assessment / Plan / Recommendation History of Present Illness  Vanessa Bartlett is an 72 y.o. 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  She was not speaking, but was making gibberish conversation.   In the ER, she was able to say that she needed to use the bathroom one time, but mostly she was aphasic.  There were no reported of fall, fever, chills, ha, focal weakness of facial droop.  Evaluation in the ER included a normal WBC, Cr of 1.6, Hb of 10.6 g per dL.  Her EKG showed rhythm appears to be afib with RVR.  Her CXR showed no infiltrate and her CT of the head showed no acute processes.  Hospital was asked to admit her for acute confusion with aphasia  Clinical Impression  Pt admitted with the above. Pt currently with functional limitations due to the deficits listed below (see PT Problem List). Pt with high risk of falls and will need 24/7 assistance/supervision if d/c home with HHPT. Pt needs extra time due to difficulty with expressive aphasia and needs extra time to answer questions and complete task.  Pt will benefit from skilled PT to increase their independence and safety with mobility to allow discharge to the venue listed below.      PT Assessment  Patient needs continued PT services    Follow Up Recommendations  Home health PT;Supervision/Assistance - 24 hour (Will need SNF if unable to provide 24/7 assist)    Does the patient have the potential to  tolerate intense rehabilitation      Barriers to Discharge Decreased caregiver support (Need to further assess)      Equipment Recommendations  Rolling walker with 5" wheels    Recommendations for Other Services     Frequency Min 3X/week    Precautions / Restrictions Precautions Precautions: Fall Restrictions Weight Bearing Restrictions: No   Pertinent Vitals/Pain No c/o pain      Mobility  Bed Mobility Bed Mobility: Supine to Sit;Sit to Supine Supine to Sit: 7: Independent Sit to Supine: 7: Independent Transfers Transfers: Sit to Stand;Stand to Sit Sit to Stand: 4: Min assist;From bed Stand to Sit: 4: Min assist;To bed Details for Transfer Assistance: cues for controlling descent and for correct hand placement Ambulation/Gait Ambulation/Gait Assistance: 4: Min assist Ambulation Distance (Feet): 150 Feet Assistive device: Rolling walker Ambulation/Gait Assistance Details: (A) to maintain balance due to ataxic gait and increase lateral sway causing pt to loose balance.   Needs max cues and (A) to manage RW.  Gait Pattern: Step-through pattern;Decreased stride length;Lateral trunk lean to right Gait velocity: decreased Stairs: No Wheelchair Mobility Wheelchair Mobility: No    Exercises     PT Diagnosis: Difficulty walking  PT Problem List: Decreased strength;Decreased activity tolerance;Decreased balance;Decreased mobility;Decreased knowledge of use of DME PT Treatment Interventions: DME instruction;Gait training;Stair training;Functional mobility training;Therapeutic activities;Therapeutic exercise;Balance training;Neuromuscular re-education;Patient/family education     PT Goals(Current goals can be found in the care plan section) Acute Rehab PT Goals Patient Stated Goal: Wants to  return home with son.  PT Goal Formulation: With patient Time For Goal Achievement: 08/19/13 Potential to Achieve Goals: Good  Visit Information  Last PT Received On:  08/12/13 Assistance Needed: +1 History of Present Illness: Vanessa Bartlett is an 72 y.o. 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  She was not speaking, but was making gibberish conversation.   In the ER, she was able to say that she needed to use the bathroom one time, but mostly she was aphasic.  There were no reported of fall, fever, chills, ha, focal weakness of facial droop.  Evaluation in the ER included a normal WBC, Cr of 1.6, Hb of 10.6 g per dL.  Her EKG showed rhythm appears to be afib with RVR.  Her CXR showed no infiltrate and her CT of the head showed no acute processes.  Hospital was asked to admit her for acute confusion with aphasia       Prior Functioning  Home Living Family/patient expects to be discharged to:: Private residence Living Arrangements: Children (Going home with son; son plans to set up 24/7) Available Help at Discharge: Family;Available PRN/intermittently Type of Home: Other(Comment) (condo (son's home)) Home Access: Level entry Home Layout: One level Additional Comments: Per RN and pt; son plans to take pt home and provide 24/7 assistance.  Prior Function Comments: Pt came from SNF however pt having difficulty with answering PLOF questions due to expressive aphasia.  Communication Communication: Expressive difficulties Dominant Hand: Right    Cognition  Cognition Arousal/Alertness: Awake/alert Behavior During Therapy: Restless Overall Cognitive Status: Difficult to assess Difficult to assess due to: Impaired communication    Extremity/Trunk Assessment Lower Extremity Assessment Lower Extremity  Assessment: RLE deficits/detail;LLE deficits/detail RLE Deficits / Details: Generally weak since CVA.   RLE Coordination: decreased gross motor (ataxia) LLE Coordination: decreased gross motor (ataxia)   Balance Balance Balance Assessed: Yes Dynamic Sitting Balance Dynamic Sitting - Balance Support: Feet supported Dynamic Sitting - Level of Assistance: 6: Modified independent (Device/Increase time) Static Standing Balance Static Standing - Balance Support: No upper extremity supported Static Standing - Level of Assistance: 5: Stand by assistance  End of Session PT - End of Session Equipment Utilized During Treatment: Gait belt Activity Tolerance: Patient tolerated treatment well Patient left: in bed;with call bell/phone within reach;with bed alarm set Nurse Communication: Mobility status  GP     Naryah Clenney 08/12/2013, 10:59 AM  Jake Shark, PT DPT [phone]

## 2013-08-12 NOTE — Discharge Summary (Signed)
Physician Discharge Summary  DEVLYNN KNOFF ZOX:[id number] DOB: 14-May-1941 DOA: 08/09/2013  PCP: Londell Moh, MD  Admit date: 08/09/2013 Discharge date: 08/13/13 Recommendations for Outpatient Follow-up:  1. Pt will need to follow up with PCP in 1 weeks post discharge 2. Please obtain BMP to evaluate electrolytes and kidney function 3. Please also check CBC to evaluate Hg and Hct levels 4. Please have INR checked on 08/14/13  Discharge Diagnoses:  Principal Problem:   CVA (cerebral infarction) Active Problems:   HYPERTENSION   MITRAL VALVE REPLACEMENT, HX OF   AORTIC VALVE REPLACEMENT, HX OF   Dizziness and giddiness   Aphasia   Orthostatic hypotension   Restless legs syndrome (RLS)   DNR no code (do not resuscitate)   Altered mental status   Acute encephalopathy   Dysarthria   Dysphagia, unspecified(787.20)   Protein-calorie malnutrition, severe Acute Encephalopathy  -hallucinations and agitation history suggests delirium -improved -repeat UA neg for pyuria  -suspect dehydration as component as BUN and Cr worse with hx of poor po intake  -May have been also a component of ciprofloxacin which can cause hallucinations and confusion as well as residual UTI and other meds  -EEG--no epileptiform discharges - RBC folate>1000  -repeat B12--pt had low B12 level on 05/17/13  -Repeat B12 is pending at the time of discharge -d/c gabapentin and amitriptyline for now as these medications may have contributed to her encephalopathy -Carotid ultrasound negative for hemodynamically significant lesions Dysphagia  -Patient failed RN swallow eval  -Discussed with speech therapy--> dysphagia 2 diet  UTI  -pt had EColi last admission  -pt son states she had been refusing her meds at [facility]  -start ceftriaxone as this will less likely increase her INR on coumadin   -Although UA did not show pyuria, she has not completed her course of antibiotics for her UTI since her discharge on 08/07/2013   -The patient will complete 4 additional days of amoxicillin which would finish 7 days of therapy; patient received 3 days of ceftriaxone while in the hospital Renal insufficiency/CKD stage IV  -at time of admit, BUN and creatinine have increased when compared to 08/05/2013  -Suspect volume depletion  -improved with IV fluids  -Serum creatinine 1.26 on the day of discharge Elevated CPK  -statin vs ?seizure  -Check CPK--1447  -Repeat CPK--132 after fluids Dysarthria/Dysphasia  -appreciate neurology  -08/09/2013 CT brain neg  -INR is supratherapeutic--likely from cipro  -Echocardiogram--EF 55-60%, no AVR or MVR leaks  -carotid Dopplers--neg for hemodynamic significant stenosis  History of mechanical AVR/MVR  -PharmD to assist with warfarin dosing  -Discussed with the patient's son that he will need to take her to her primary care physician for INR monitoring. Orthostatic hypotension  -Continue Midodrine if patient is able to take po  -up with assist only  -PT set up at home via home health -Discontinue gabapentin as this may also contribute to orthostatic hypotension Atrial Tachycardia  -EKG in ED showed PAT/SVT  -now back in sinus, rate controlled  -Echo--EF 55-60% without perivalvular leaks.  No wall motion abnormalities -metoprolol d/ced last admit due to orthostatic hypotension  -monitor closely  Mild Hypoglycemia  -resolved with increased po intake  -no hx DM  Goals of Care  -son is amendable to speak with palliative medicine team  -appreciate Dr. Ladona Ridgel who met with son and clarified goals of care -pt is DNR  Family Communication: Spoke with son at bedside  Disposition Plan: son wants to take pt home  Antibiotics:  Ceftriaxone  08/10/2013>>>   Discharge Condition: Stable  Disposition:  home  Diet:dysphagia 2 Wt Readings from Last 3 Encounters:  08/13/13 51.619 kg (113 lb 12.8 oz)  08/04/13 55.611 kg (122 lb 9.6 oz)  05/17/13 54.885 kg (121 lb)    History of  present illness:  72 y.o. female with hx of orthostatic hypotension, on increasing dose of midodrine last hospitalization, nursing home resident, with hx of both mechanical Aortic and Mitral Valve replacements on therapeutic coumadin, hx of left brain CVA with residual aphasia, hx of confusion with UTI, brought to the ER DUE to increasing confusion as well as aphasia. According to the patient's son, the patient's confusion and agitation were associated with hallucinations that began even prior to her last discharge on 08/07/2013. He stated that the patient's agitation and confusion worsened when she was at the nursing facility for a period of 2 days. In addition, she had increasing aphasia. There were no reports of syncope or falls. EKG in the emergency department showed atrial tachycardia, but the patient reverted to sinus rhythm throughout the rest of her hospitalization without any further tachyarrhythmias. The patient has been compliant with her warfarin. Her INR was 2.95 on the day of admission.  PharmD assist with dosing of warfarin. The patient was noted to be in acute on chronic renal failure. The patient was started on intravenous fluids. Her renal function improved. CT of the brain was negative for any acute findings. Repeat CT of the brain was also negative. Neurology was consulted and agreed with current management. EEG was ordered and was negative. The patient was started on ceftriaxone for UTI. Carotid ultrasound was negative. Medications the may have caused confusion were discontinued. The patient's encephalopathy continued to improve. After discussion with palliative medicine, the patient's stated that he wanted to take the patient home rather than going back to the nursing facility.    Consultants: Neurology  Discharge Exam: Filed Vitals:   08/13/13 0500  BP: 136/78  Pulse: 80  Temp: 98 F (36.7 C)  Resp: 18   Filed Vitals:   08/12/13 0804 08/12/13 1701 08/12/13 2100 08/13/13 0500   BP: 118/75 132/66 131/58 136/78  Pulse: 86 80 80 80  Temp: 98.2 F (36.8 C) 98.1 F (36.7 C) 98.1 F (36.7 C) 98 F (36.7 C)  TempSrc: Oral Oral    Resp: 18 18 18 18   Height:      Weight:    51.619 kg (113 lb 12.8 oz)  SpO2: 99% 97% 98% 98%   General: A&O x 3, NAD, pleasant, cooperative Cardiovascular: RRR, no rub, no gallop, no S3 Respiratory: CTAB, no wheeze, no rhonchi Abdomen:soft, nontender, nondistended, positive bowel sounds Extremities: No edema, No lymphangitis, no petechiae  Discharge Instructions      Discharge Orders   Future Appointments Provider Department Dept Phone   08/25/2013 3:30 PM Nilda Riggs, NP [facility] [phone]   09/19/2013 4:30 PM Lewayne Bunting, MD [facility] [place] Office [phone]   Future Orders Complete By Expires   Increase activity slowly  As directed        Medication List    STOP taking these medications       amitriptyline 25 MG tablet  Commonly known as:  ELAVIL     ciprofloxacin 250 MG tablet  Commonly known as:  CIPRO     gabapentin 800 MG tablet  Commonly known as:  NEURONTIN      TAKE these medications  amoxicillin 500 MG capsule  Commonly known as:  AMOXIL  Take 1 capsule (500 mg total) by mouth every 8 (eight) hours.     atorvastatin 10 MG tablet  Commonly known as:  LIPITOR  Take 10 mg by mouth daily.     CALCIUM 500 + D PO  Take 500 mg by mouth 2 (two) times daily.     feeding supplement (ENSURE COMPLETE) Liqd  Take 237 mLs by mouth 2 (two) times daily between meals.     lidocaine 5 %  Commonly known as:  LIDODERM  Place 1 patch onto the skin daily. Remove & Discard patch within 12 hours or as directed by MD     LORazepam 0.5 MG tablet  Commonly known as:  ATIVAN  Take 0.5 mg by mouth 4 (four) times daily as needed for anxiety.     midodrine 5 MG tablet  Commonly known as:  PROAMATINE  Take 1 tablet (5 mg total) by mouth 3 (three) times daily with  meals.     nitroGLYCERIN 0.4 MG SL tablet  Commonly known as:  NITROSTAT  Place 0.4 mg under the tongue every 5 (five) minutes as needed for chest pain.     pramipexole 0.25 MG tablet  Commonly known as:  MIRAPEX  Take 0.25 mg by mouth at bedtime.     sertraline 100 MG tablet  Commonly known as:  ZOLOFT  Take 100 mg by mouth daily.     warfarin 5 MG tablet  Commonly known as:  COUMADIN  Take 5 mg by mouth See admin instructions. Pt takes on Tues Thurs, Sat, Sun     warfarin 2.5 MG tablet  Commonly known as:  COUMADIN  Take 2.5 mg by mouth See admin instructions. Pt takes one daily on Mon, Wed, Fri         The results of significant diagnostics from this hospitalization (including imaging, microbiology, ancillary and laboratory) are listed below for reference.    Significant Diagnostic Studies: Dg Chest 1 View  08/09/2013   CLINICAL DATA:  Two-day history of altered mental status.  EXAM: CHEST - 1 VIEW  COMPARISON:  None.  FINDINGS: The lungs are well-expanded. The cardiopericardial silhouette is normal in size. The patient has undergone previous median sternotomy and reportedly aortic and mitral valve replacement. The pulmonary vascularity is not engorged. The interstitial markings are minimally prominent diffusely. There is no pleural effusion or pneumothorax. The observed portions of the bony thorax exhibit no acute abnormality. There are deformities of the 1st ribs bilaterally.  IMPRESSION: There is no evidence of pneumonia nor definite evidence of CHF. There are mildly increased interstitial markings in both lungs but these are of uncertain etiology and duration. This signed report   Electronically Signed   By: Shailene Demonbreun  [place]   On: 08/09/2013 18:59   Ct Head Wo Contrast  08/11/2013   CLINICAL DATA:  Difficulty speaking.  EXAM: CT HEAD WITHOUT CONTRAST  TECHNIQUE: Contiguous axial images were obtained from the base of the skull through the vertex without intravenous contrast.   COMPARISON:  CT 08/09/2013.  FINDINGS: Stable severe bilateral temporal parietal, left side greater than right, cerebral atrophy is present. No mass. No hemorrhage. No hydrocephalus. No acute bony abnormality identified. Visualized paranasal sinuses are clear. Orbits are unremarkable. Mastoids are unremarkable.  IMPRESSION: Stable cerebral atrophy.  No acute abnormality.   Electronically Signed   By: Maisie Fus  Register   On: 08/11/2013 17:01   Ct Head Wo Contrast  08/09/2013  CLINICAL DATA:  Altered mental status.  EXAM: CT HEAD WITHOUT CONTRAST  TECHNIQUE: Contiguous axial images were obtained from the base of the skull through the vertex without intravenous contrast.  COMPARISON:  08/04/2013  FINDINGS: No skull fracture is noted. Paranasal sinuses and mastoid air cells are unremarkable. Stable encephalomalacia left parietal lobe. Stable encephalomalacia and right temporal lobe. Again noted cerebral atrophy. No acute cortical infarction. No mass lesion is noted on this unenhanced scan.  IMPRESSION: No acute intracranial abnormality.  No significant change.   Electronically Signed   By: Natasha Mead M.D.   On: 08/09/2013 18:53   Ct Head Wo Contrast  08/04/2013   CLINICAL DATA:  Patient on Coumadin with multiple recent falls  EXAM: CT HEAD WITHOUT CONTRAST  TECHNIQUE: Contiguous axial images were obtained from the base of the skull through the vertex without intravenous contrast.  COMPARISON:  06/08/2013, 02/15/2012  FINDINGS: No hemorrhage or extra-axial fluid. No skull fracture. Encephalomalacia left parietal lobe stable. Encephalomalacia right temporal lobe stable. Mild diffuse atrophy unchanged. No evidence of mass, acute infarct, or hydrocephalus.  IMPRESSION: No acute findings.   Electronically Signed   By: Esperanza Heir M.D.   On: 08/04/2013 17:13     Microbiology: Recent Results (from the past 240 hour(s))  URINE CULTURE     Status: None   Collection Time    08/04/13  5:03 PM      Result Value  Range Status   Specimen Description URINE, CLEAN CATCH   Final   Special Requests NONE   Final   Culture  Setup Time     Final   Value: 08/04/2013 22:06     Performed at [facility] Count     Final   Value: 50,000 COLONIES/ML     Performed at [facility]   Culture     Final   Value: ESCHERICHIA COLI     Performed at [facility]   Report Status 08/06/2013 FINAL   Final   Organism ID, Bacteria ESCHERICHIA COLI   Final  CULTURE, BLOOD (SINGLE)     Status: None   Collection Time    08/09/13  7:55 PM      Result Value Range Status   Specimen Description BLOOD RIGHT ANTECUBITAL   Final   Special Requests BOTTLES DRAWN AEROBIC ONLY 10CC   Final   Culture  Setup Time     Final   Value: 08/10/2013 02:55     Performed at [facility]   Culture     Final   Value:        BLOOD CULTURE RECEIVED NO GROWTH TO DATE CULTURE WILL BE HELD FOR 5 DAYS BEFORE ISSUING A FINAL NEGATIVE REPORT     Performed at [facility]   Report Status PENDING   Incomplete  MRSA PCR SCREENING     Status: None   Collection Time    08/10/13  2:49 AM      Result Value Range Status   MRSA by PCR NEGATIVE  NEGATIVE Final   Comment:            The GeneXpert MRSA Assay (FDA     approved for NASAL specimens     only), is one component of a     comprehensive MRSA colonization     surveillance program. It is not     intended to diagnose MRSA     infection nor to guide or  monitor treatment for     MRSA infections.     Labs: Basic Metabolic Panel:  Recent Labs Lab 08/09/13 1720 08/10/13 1035 08/11/13 0505 08/12/13 0610  NA 143 144 148* 142  K 4.4 3.5 4.1 3.9  CL 104 107 113* 108  CO2 19 24 28 27   GLUCOSE 75 78 95 90  BUN 28* 24* 20 15  CREATININE 1.78* 1.48* 1.47* 1.26*  CALCIUM 9.7 8.9 9.1 8.7  MG  --   --   --  2.0   Liver Function Tests:  Recent Labs Lab 08/09/13 1720 08/10/13 1035 08/11/13 0505  AST 139* 133* 101*  ALT 23 22 21   ALKPHOS  135* 113 117  BILITOT 0.5 0.4 0.2*  PROT 7.2 6.2 6.1  ALBUMIN 3.4* 2.9* 2.7*   No results found for this basename: LIPASE, AMYLASE,  in the last 168 hours  Recent Labs Lab 08/09/13 1958  AMMONIA 18   CBC:  Recent Labs Lab 08/09/13 1720 08/11/13 0505  WBC 10.7* 4.3  HGB 10.6* 9.4*  HCT 32.5* 29.6*  MCV 93.1 94.6  PLT 183 173   Cardiac Enzymes:  Recent Labs Lab 08/10/13 1035 08/12/13 0610  CKTOTAL 1447* 132   BNP: No components found with this basename: POCBNP,  CBG:  Recent Labs Lab 08/12/13 1657 08/12/13 2006 08/13/13 0008 08/13/13 0448 08/13/13 0802  GLUCAP 98 138* 112* 95 117*    Time coordinating discharge:  Greater than 30 minutes  Signed:  Cleola Perryman, DO [facility] Pager: [phone] 08/13/2013, 8:57 AM

## 2013-08-12 NOTE — Progress Notes (Signed)
Occupational Therapy Evaluation Patient Details Name: Vanessa Bartlett MRN: [id number] DOB: [birth year] Today's Date: 08/12/2013 Time: 4098-1191 OT Time Calculation (min): 23 min  OT Assessment / Plan / Recommendation History of present illness Vanessa Bartlett is an 72 y.o. 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  She was not speaking, but was making gibberish conversation.   In the ER, she was able to say that she needed to use the bathroom one time, but mostly she was aphasic.  There were no reported of fall, fever, chills, ha, focal weakness of facial droop.  Evaluation in the ER included a normal WBC, Cr of 1.6, Hb of 10.6 g per dL.  Her EKG showed rhythm appears to be afib with RVR.  Her CXR showed no infiltrate and her CT of the head showed no acute processes.  Hospital was asked to admit her for acute confusion with aphasia   Clinical Impression   PTA, pt was at SNF for rehab - recently D/C from [facility]. Prior to that, pt lived at home independently. Pt presents with a functional decline and plans to D/C home with her son. Pt is appropriate for [facility] services, but will need 24/7 assistance. Will plan to work with family/son to begin education regarding ADL, mobility and home safety to facilitate safe D/C home and reduce risk of falls and admission.     OT Assessment  Patient needs continued OT Services    Follow Up Recommendations  Home health OT    Barriers to Discharge      Equipment Recommendations  3 in 1 bedside comode    Recommendations for Other Services    Frequency  Min 2X/week    Precautions / Restrictions  Precautions Precautions: Fall Restrictions Weight Bearing Restrictions: No   Pertinent Vitals/Pain no apparent distress     ADL  Eating/Feeding:  (modified diet) Grooming: Set up Upper Body Bathing: Set up Where Assessed - Upper Body Bathing: Supported sitting Lower Body Bathing: Minimal assistance Where Assessed - Lower Body Bathing: Supported sit to stand Upper Body Dressing: Set up Where Assessed - Upper Body Dressing: Supported sitting Lower Body Dressing: Minimal assistance Where Assessed - Lower Body Dressing: Supported sit to Pharmacist, hospital: Minimal Dentist Method: Sit to Barista: Materials engineer and Hygiene: Min guard Where Assessed - Toileting Clothing Manipulation and Hygiene: Sit to stand from 3-in-1 or toilet Transfers/Ambulation Related to ADLs: minA ADL Comments: Pt does well, however, easily fatigues. At risk for falls. Sister states she has had recurrent falls.     OT Diagnosis: Generalized weakness;Ataxia;Altered mental status  OT Problem List: Decreased strength;Decreased activity tolerance;Impaired balance (sitting and/or standing);Decreased knowledge of use of DME or AE;Cardiopulmonary status limiting activity;Impaired UE functional use OT Treatment Interventions: Self-care/ADL training;DME and/or AE instruction;Therapeutic activities;Patient/family education   OT Goals(Current goals can be found in the care plan section) Acute Rehab OT Goals Patient Stated Goal: Wants to return home with son.  OT Goal Formulation: With patient Time For Goal Achievement: 08/26/13 Potential to Achieve Goals: Good  Visit Information  Last OT Received On: 08/12/13 Assistance Needed: +1 History of  Present Illness: Vanessa Bartlett is an 72 y.o. female with hx of orthostatic hypotension, on increasing dose of midodrine last hospitalization, nursing home resident, with hx of both mechanical Aortic  and Mitral Valve replacements on therapeutic coumadin, hx of left brain CVA with residual aphasia, hx of confusion with UTI, brought to the ER as she was doing well 2 days ago, conversing and ambulating, and sudden onset of confusion, along with aphasia.  She was not speaking, but was making gibberish conversation.   In the ER, she was able to say that she needed to use the bathroom one time, but mostly she was aphasic.  There were no reported of fall, fever, chills, ha, focal weakness of facial droop.  Evaluation in the ER included a normal WBC, Cr of 1.6, Hb of 10.6 g per dL.  Her EKG showed rhythm appears to be afib with RVR.  Her CXR showed no infiltrate and her CT of the head showed no acute processes.  Hospital was asked to admit her for acute confusion with aphasia       Prior Functioning     Home Living Family/patient expects to be discharged to:: Private residence Living Arrangements: Children (Going home with son; son plans to set up 24/7) Available Help at Discharge: Family;Available PRN/intermittently Type of Home: Other(Comment) (condo (son's home)) Home Access: Level entry Home Layout: One level Additional Comments: Per RN and pt; son plans to take pt home and provide 24/7 assistance.  Prior Function Level of Independence: Independent Comments: Pt came from SNF however pt having difficulty with answering PLOF questions due to expressive aphasia.  Communication Communication: Expressive difficulties Dominant Hand: Right         Vision/Perception Vision - History Patient Visual Report: No change from baseline   Cognition  Cognition Arousal/Alertness: Awake/alert Behavior During Therapy: Restless Overall Cognitive Status: Difficult to assess Difficult to assess due to: Impaired communication    Extremity/Trunk Assessment Upper Extremity Assessment Upper Extremity Assessment: RUE deficits/detail RUE Deficits / Details: ataxia Lower Extremity Assessment Lower  Extremity Assessment: RLE deficits/detail;LLE deficits/detail RLE Deficits / Details: Generally weak since CVA.   RLE Coordination: decreased gross motor (ataxia) LLE Coordination: decreased gross motor (ataxia)     Mobility Bed Mobility Bed Mobility: Supine to Sit;Sit to Supine Supine to Sit: 7: Independent Sit to Supine: 7: Independent Transfers Sit to Stand: 4: Min assist;From bed Stand to Sit: 4: Min assist;To bed Details for Transfer Assistance: cues for controlling descent and for correct hand placement     Exercise     Balance Balance Balance Assessed: Yes Dynamic Sitting Balance Dynamic Sitting - Balance Support: Feet supported Dynamic Sitting - Level of Assistance: 6: Modified independent (Device/Increase time) Static Standing Balance Static Standing - Balance Support: No upper extremity supported Static Standing - Level of Assistance: 5: Stand by assistance   End of Session OT - End of Session Activity Tolerance: Patient tolerated treatment well Patient left: in bed;with call bell/phone within reach Nurse Communication: Mobility status  GO     Lourene Hoston,HILLARY 08/12/2013, 1:55 PM [facility], OTR/L  [phone] 08/12/2013

## 2013-08-12 NOTE — Progress Notes (Signed)
Subjective: fels back to her normal self. Sister is present who confirms this.   Exam: Filed Vitals:   08/12/13 0804  BP: 118/75  Pulse: 86  Temp: 98.2 F (36.8 C)  Resp: 18   Gen: In bed, NAD MS: awake, alert, expressive aphasia but is able to communicate ZO:XWRUE, mild right field loss Motor: 4/5 throughout right side(basline weakness) Sensory:decreased on right  Impression: 72 year old female with worsening of right-sided weakness as well as visual hallucinations consistent with delirium as well as "peeling the onion." With her back to her baseline status, I don't feel that new stroke is at all likely don't feel that seizure is likely either.  Recommendations: 1) at this time no further recommendations from a neurologic perspective, would continue treating underlying medical conditions such as her urinary tract infection. 2) no further recommendations at this time neurology will sign off, please call if any further questions remain  Ritta Slot, MD [facility] [phone]  If 7pm- 7am, please page neurology on call at [phone].

## 2013-08-13 LAB — GLUCOSE, CAPILLARY
Glucose-Capillary: 112 mg/dL — ABNORMAL HIGH (ref 70–99)
Glucose-Capillary: 117 mg/dL — ABNORMAL HIGH (ref 70–99)
Glucose-Capillary: 95 mg/dL (ref 70–99)

## 2013-08-13 MED ORDER — WARFARIN SODIUM 5 MG PO TABS
5.0000 mg | ORAL_TABLET | Freq: Once | ORAL | Status: AC
  Administered 2013-08-13: 5 mg via ORAL
  Filled 2013-08-13: qty 1

## 2013-08-13 MED ORDER — AMOXICILLIN 500 MG PO CAPS: 500.0000 mg | ORAL_CAPSULE | Freq: Three times a day (TID) | ORAL | Status: DC

## 2013-08-13 MED ORDER — AMOXICILLIN 500 MG PO CAPS
500.0000 mg | ORAL_CAPSULE | Freq: Three times a day (TID) | ORAL | Status: DC
  Filled 2013-08-13 (×4): qty 1

## 2013-08-13 NOTE — Progress Notes (Signed)
ANTICOAGULATION CONSULT NOTE - Follow Up Consult  Pharmacy Consult for Coumadin Indication: AVR/MVR  Allergies  Allergen Reactions  . Codeine     Patient Measurements: Height: 5\' 6"  (167.6 cm) Weight: 113 lb 12.8 oz (51.619 kg) IBW/kg (Calculated) : 59.3  Vital Signs: Temp: 98 F (36.7 C) (11/30 0500) BP: 136/78 mmHg (11/30 0500) Pulse Rate: 80 (11/30 0500)  Labs:  Recent Labs  08/10/13 1035 08/11/13 0505 08/12/13 0610 08/13/13 0508  HGB  --  9.4*  --   --   HCT  --  29.6*  --   --   PLT  --  173  --   --   LABPROT  --  28.7* 22.2* 24.1*  INR  --  2.82* 2.02* 2.24*  CREATININE 1.48* 1.47* 1.26*  --   CKTOTAL 1447*  --  132  --     Estimated Creatinine Clearance: 33.8 ml/min (by C-G formula based on Cr of 1.26).  Assessment: 72yof continues on coumadin for AVR/MVR. Admitted 11/26 with a therapeutic INR, but does not appear any dose was given on day of admission. INR then increased to 3.95 (likely due to cipro that she was on pta) and dose was held 11/27. Hgb low but stable, platelets ok, no bleeding reported. Cipro has been changed to ceftriaxone which shouldn't affect INR.  INR trending up today after two days of boost of 7.5mg . Will give 5mg  home dose before discharge today.   Home dose: 5mg  daily except 2.5mg  MWF  Goal of Therapy:  INR 2.5-3.5 Monitor platelets by anticoagulation protocol: Yes   Plan:  1) Coumadin 5mg  x 1  Thank you, Piedad Climes, PharmD Clinical Pharmacist - Resident Pager: [phone] Pharmacy: [phone] 08/13/2013 9:20 AM

## 2013-08-13 NOTE — Progress Notes (Addendum)
   CARE MANAGEMENT NOTE 08/13/2013  Patient:  Vanessa Bartlett, Vanessa Bartlett   Account Number:  [id number]  Date Initiated:  08/11/2013  Documentation initiated by:  Donn Pierini  Subjective/Objective Assessment:   Pt admitted with new right sided weakness and aphasia as well as AMS     Action/Plan:   PTA pt was at [facility]- CSW consulted for return to SNF when medically stable   Anticipated DC Date:  08/14/2013   Anticipated DC Plan:  SKILLED NURSING FACILITY  In-house referral  Clinical Social Worker      DC Associate Professor  CM consult      [facility] Choice  HOME HEALTH   Choice offered to / List presented to:  C-4 Adult Children   DME arranged  3-N-1  SHOWER STOOL      DME agency  [facility]     HH arranged  HH-1 RN  HH-2 PT  HH-3 OT  HH-6 SOCIAL WORKER      HH agency  [facility]   Status of service:  Completed, signed off Medicare Important Message given?   (If response is "NO", the following Medicare IM given date fields will be blank) Date Medicare IM given:   Date Additional Medicare IM given:    Discharge Disposition:  HOME W HOME HEALTH SERVICES  Per UR Regulation:  Reviewed for med. necessity/level of care/duration of stay  If discussed at Long Length of Stay Meetings, dates discussed:    Comments:  08/13/13 09:15 CM spoke with Casimiro Needle (son of pt) [phone] for choice of [facility] agency and to verify address and contact number.  Pt will be going to Michael's address: [address] [place], [place], [place] [address].  Referral to [facility] faxed for HHPT/OT/RN/SW.  DME in room.  No other CM needs were communicated.  Freddy Jaksch, BSN, CM [phone].

## 2013-08-13 NOTE — Progress Notes (Signed)
Patient [name]      DOB: [birth year]      AVW:[id number]  Patient improved.  Spoke with son via phone yesterday.  He was preparing to bring her home and was not in hospital.  He as a MOST form and advanced directives packet that can be completed by PCP.  Took a call regarding home Dme needs and updated CM on call.   For discharge today.  Vanessa Aronoff L. Ladona Ridgel, MD MBA The Palliative Medicine Team at [facility] Phone: [phone] Pager: [phone]

## 2013-08-15 ENCOUNTER — Encounter (HOSPITAL_COMMUNITY): Payer: Self-pay | Admitting: Emergency Medicine

## 2013-08-15 ENCOUNTER — Inpatient Hospital Stay (HOSPITAL_COMMUNITY)
Admission: EM | Admit: 2013-08-15 | Discharge: 2013-08-17 | DRG: 177 | Disposition: A | Discharge status: Hospice | Payer: Medicare Other | Attending: Internal Medicine | Admitting: Internal Medicine

## 2013-08-15 ENCOUNTER — Observation Stay (HOSPITAL_COMMUNITY): Payer: Medicare Other

## 2013-08-15 DIAGNOSIS — E785 Hyperlipidemia, unspecified: Secondary | ICD-10-CM | POA: Diagnosis present

## 2013-08-15 DIAGNOSIS — G319 Degenerative disease of nervous system, unspecified: Secondary | ICD-10-CM | POA: Diagnosis present

## 2013-08-15 DIAGNOSIS — Z515 Encounter for palliative care: Secondary | ICD-10-CM

## 2013-08-15 DIAGNOSIS — I10 Essential (primary) hypertension: Secondary | ICD-10-CM | POA: Diagnosis present

## 2013-08-15 DIAGNOSIS — N289 Disorder of kidney and ureter, unspecified: Secondary | ICD-10-CM | POA: Diagnosis present

## 2013-08-15 DIAGNOSIS — F03918 Unspecified dementia, unspecified severity, with other behavioral disturbance: Secondary | ICD-10-CM | POA: Diagnosis present

## 2013-08-15 DIAGNOSIS — I951 Orthostatic hypotension: Secondary | ICD-10-CM | POA: Diagnosis present

## 2013-08-15 DIAGNOSIS — R1310 Dysphagia, unspecified: Secondary | ICD-10-CM | POA: Diagnosis present

## 2013-08-15 DIAGNOSIS — G8929 Other chronic pain: Secondary | ICD-10-CM | POA: Diagnosis present

## 2013-08-15 DIAGNOSIS — J690 Pneumonitis due to inhalation of food and vomit: Principal | ICD-10-CM | POA: Diagnosis present

## 2013-08-15 DIAGNOSIS — IMO0002 Reserved for concepts with insufficient information to code with codable children: Secondary | ICD-10-CM

## 2013-08-15 DIAGNOSIS — K219 Gastro-esophageal reflux disease without esophagitis: Secondary | ICD-10-CM | POA: Diagnosis present

## 2013-08-15 DIAGNOSIS — M4800 Spinal stenosis, site unspecified: Secondary | ICD-10-CM | POA: Diagnosis present

## 2013-08-15 DIAGNOSIS — M810 Age-related osteoporosis without current pathological fracture: Secondary | ICD-10-CM | POA: Diagnosis present

## 2013-08-15 DIAGNOSIS — I69920 Aphasia following unspecified cerebrovascular disease: Secondary | ICD-10-CM

## 2013-08-15 DIAGNOSIS — G2581 Restless legs syndrome: Secondary | ICD-10-CM | POA: Diagnosis present

## 2013-08-15 DIAGNOSIS — Z7901 Long term (current) use of anticoagulants: Secondary | ICD-10-CM

## 2013-08-15 DIAGNOSIS — F3289 Other specified depressive episodes: Secondary | ICD-10-CM | POA: Diagnosis present

## 2013-08-15 DIAGNOSIS — E43 Unspecified severe protein-calorie malnutrition: Secondary | ICD-10-CM | POA: Diagnosis present

## 2013-08-15 DIAGNOSIS — M545 Low back pain, unspecified: Secondary | ICD-10-CM | POA: Diagnosis present

## 2013-08-15 DIAGNOSIS — R451 Restlessness and agitation: Secondary | ICD-10-CM

## 2013-08-15 DIAGNOSIS — Z954 Presence of other heart-valve replacement: Secondary | ICD-10-CM

## 2013-08-15 DIAGNOSIS — Z9889 Other specified postprocedural states: Secondary | ICD-10-CM

## 2013-08-15 DIAGNOSIS — I099 Rheumatic heart disease, unspecified: Secondary | ICD-10-CM | POA: Diagnosis present

## 2013-08-15 DIAGNOSIS — F329 Major depressive disorder, single episode, unspecified: Secondary | ICD-10-CM | POA: Diagnosis present

## 2013-08-15 DIAGNOSIS — Z79899 Other long term (current) drug therapy: Secondary | ICD-10-CM

## 2013-08-15 DIAGNOSIS — F0391 Unspecified dementia with behavioral disturbance: Secondary | ICD-10-CM | POA: Diagnosis present

## 2013-08-15 DIAGNOSIS — Z66 Do not resuscitate: Secondary | ICD-10-CM | POA: Diagnosis present

## 2013-08-15 DIAGNOSIS — E8770 Fluid overload, unspecified: Secondary | ICD-10-CM | POA: Diagnosis present

## 2013-08-15 DIAGNOSIS — R404 Transient alteration of awareness: Secondary | ICD-10-CM | POA: Diagnosis present

## 2013-08-15 DIAGNOSIS — E8779 Other fluid overload: Secondary | ICD-10-CM | POA: Diagnosis present

## 2013-08-15 DIAGNOSIS — R410 Disorientation, unspecified: Secondary | ICD-10-CM

## 2013-08-15 DIAGNOSIS — F17200 Nicotine dependence, unspecified, uncomplicated: Secondary | ICD-10-CM | POA: Diagnosis present

## 2013-08-15 DIAGNOSIS — Z681 Body mass index (BMI) 19 or less, adult: Secondary | ICD-10-CM

## 2013-08-15 DIAGNOSIS — Z8249 Family history of ischemic heart disease and other diseases of the circulatory system: Secondary | ICD-10-CM

## 2013-08-15 DIAGNOSIS — Z885 Allergy status to narcotic agent status: Secondary | ICD-10-CM

## 2013-08-15 LAB — CBC WITH DIFFERENTIAL/PLATELET
Basophils Absolute: 0 10*3/uL (ref 0.0–0.1)
Basophils Relative: 0 % (ref 0–1)
Eosinophils Absolute: 0 10*3/uL (ref 0.0–0.7)
Eosinophils Relative: 0 % (ref 0–5)
Lymphocytes Relative: 7 % — ABNORMAL LOW (ref 12–46)
Lymphs Abs: 0.6 10*3/uL — ABNORMAL LOW (ref 0.7–4.0)
MCH: 29.8 pg (ref 26.0–34.0)
MCHC: 33.7 g/dL (ref 30.0–36.0)
MCV: 88.4 fL (ref 78.0–100.0)
Platelets: 233 10*3/uL (ref 150–400)
RDW: 13.8 % (ref 11.5–15.5)
WBC: 8.2 10*3/uL (ref 4.0–10.5)

## 2013-08-15 LAB — URINE MICROSCOPIC-ADD ON

## 2013-08-15 LAB — COMPREHENSIVE METABOLIC PANEL
ALT: 20 U/L (ref 0–35)
AST: 52 U/L — ABNORMAL HIGH (ref 0–37)
Albumin: 3.5 g/dL (ref 3.5–5.2)
CO2: 22 mEq/L (ref 19–32)
Calcium: 9.9 mg/dL (ref 8.4–10.5)
Creatinine, Ser: 1.3 mg/dL — ABNORMAL HIGH (ref 0.50–1.10)
GFR calc non Af Amer: 41 mL/min — ABNORMAL LOW (ref >90)
Sodium: 135 mEq/L (ref 135–145)
Total Protein: 7.6 g/dL (ref 6.0–8.3)

## 2013-08-15 LAB — URINALYSIS, ROUTINE W REFLEX MICROSCOPIC
Glucose, UA: NEGATIVE mg/dL
Specific Gravity, Urine: 1.023 (ref 1.005–1.030)
Urobilinogen, UA: 1 mg/dL (ref 0.0–1.0)
pH: 6 (ref 5.0–8.0)

## 2013-08-15 LAB — PROTIME-INR: Prothrombin Time: 37.1 seconds — ABNORMAL HIGH (ref 11.6–15.2)

## 2013-08-15 LAB — PRO B NATRIURETIC PEPTIDE: Pro B Natriuretic peptide (BNP): 1786 pg/mL — ABNORMAL HIGH (ref 0–125)

## 2013-08-15 MED ORDER — ENSURE COMPLETE PO LIQD
237.0000 mL | Freq: Two times a day (BID) | ORAL | Status: DC
  Administered 2013-08-15 – 2013-08-17 (×3): 237 mL via ORAL

## 2013-08-15 MED ORDER — HALOPERIDOL LACTATE 5 MG/ML IJ SOLN
2.0000 mg | Freq: Four times a day (QID) | INTRAMUSCULAR | Status: DC | PRN
  Administered 2013-08-16: 2 mg via INTRAVENOUS
  Filled 2013-08-15: qty 1

## 2013-08-15 MED ORDER — LORAZEPAM 2 MG/ML IJ SOLN
0.5000 mg | Freq: Once | INTRAMUSCULAR | Status: AC
  Administered 2013-08-15: 0.5 mg via INTRAVENOUS
  Filled 2013-08-15: qty 1

## 2013-08-15 MED ORDER — ACETAMINOPHEN 325 MG PO TABS: 650.0000 mg | ORAL_TABLET | Freq: Four times a day (QID) | ORAL | Status: DC | PRN

## 2013-08-15 MED ORDER — SODIUM CHLORIDE 0.9 % IJ SOLN: 3.0000 mL | Freq: Two times a day (BID) | INTRAMUSCULAR | Status: DC

## 2013-08-15 MED ORDER — HALOPERIDOL LACTATE 5 MG/ML IJ SOLN
2.0000 mg | Freq: Once | INTRAMUSCULAR | Status: AC
  Administered 2013-08-15: 2 mg via INTRAVENOUS
  Filled 2013-08-15: qty 1

## 2013-08-15 MED ORDER — LORAZEPAM 2 MG/ML IJ SOLN
0.5000 mg | INTRAMUSCULAR | Status: DC | PRN
  Administered 2013-08-15 – 2013-08-16 (×2): 0.5 mg via INTRAVENOUS
  Filled 2013-08-15 (×2): qty 1

## 2013-08-15 MED ORDER — ACETAMINOPHEN 650 MG RE SUPP: 650.0000 mg | Freq: Four times a day (QID) | RECTAL | Status: DC | PRN

## 2013-08-15 MED ORDER — ONDANSETRON HCL 4 MG/2ML IJ SOLN: 4.0000 mg | Freq: Four times a day (QID) | INTRAMUSCULAR | Status: DC | PRN

## 2013-08-15 MED ORDER — MIDODRINE HCL 5 MG PO TABS
5.0000 mg | ORAL_TABLET | Freq: Three times a day (TID) | ORAL | Status: DC
  Administered 2013-08-16 (×2): 5 mg via ORAL
  Filled 2013-08-15 (×6): qty 1

## 2013-08-15 MED ORDER — ONDANSETRON HCL 4 MG PO TABS: 4.0000 mg | ORAL_TABLET | Freq: Four times a day (QID) | ORAL | Status: DC | PRN

## 2013-08-15 MED ORDER — ATORVASTATIN CALCIUM 10 MG PO TABS
10.0000 mg | ORAL_TABLET | Freq: Every day | ORAL | Status: DC
  Filled 2013-08-15 (×2): qty 1

## 2013-08-15 MED ORDER — LEVOFLOXACIN 750 MG PO TABS
750.0000 mg | ORAL_TABLET | ORAL | Status: DC
  Filled 2013-08-15 (×2): qty 1

## 2013-08-15 MED ORDER — FUROSEMIDE 10 MG/ML IJ SOLN
40.0000 mg | Freq: Two times a day (BID) | INTRAMUSCULAR | Status: DC
  Administered 2013-08-15 – 2013-08-16 (×2): 40 mg via INTRAVENOUS
  Filled 2013-08-15 (×4): qty 4

## 2013-08-15 MED ORDER — SODIUM CHLORIDE 0.9 % IJ SOLN: 3.0000 mL | INTRAMUSCULAR | Status: DC | PRN

## 2013-08-15 MED ORDER — SERTRALINE HCL 100 MG PO TABS
100.0000 mg | ORAL_TABLET | Freq: Every day | ORAL | Status: DC
  Administered 2013-08-16 – 2013-08-17 (×2): 100 mg via ORAL
  Filled 2013-08-15 (×3): qty 1

## 2013-08-15 MED ORDER — SODIUM CHLORIDE 0.9 % IV SOLN: 250.0000 mL | INTRAVENOUS | Status: DC | PRN

## 2013-08-15 NOTE — Progress Notes (Signed)
INITIAL NUTRITION ASSESSMENT  DOCUMENTATION CODES Per approved criteria  -Underweight   INTERVENTION: Provide Ensure Complete po BID when diet is advanced, each supplement provides 350 kcal and 13 grams of protein. Provide Magic Cup BID when diet is advanced   NUTRITION DIAGNOSIS: Inadequate oral intake related to dementia as evidenced by Vanessa Bartlett refusing to eat past 2 days per chart and 7% weight loss in less than 2 weeks.  Goal: Vanessa Bartlett to meet >/= 90% of their estimated nutrition needs   Monitor:  PO intake Weight Labs  Reason for Assessment: Malnutrition Screening Tool, score of 3  72 y.o. female  Admitting Dx: Dementia with behavioral disturbance  ASSESSMENT: 72 y.o. female just discharged from the hospital service last week for episodes of agitation and hallucinations felt to be in part from dementia but also exacerbated by possible medication including antibiotics. Patient was home for only a few days and started having decreased by mouth increased agitation and violent outbursts.  Vanessa Bartlett NPO at time of visit; now on dysphagia 2 diet. RN reports Vanessa Bartlett has severe dementia and requests not to disturb Vanessa Bartlett at this time. RN reports Vanessa Bartlett likely needs a swallow evaluation before diet can be advanced. Per chart, Vanessa Bartlett quit eating 2 days ago. Vanessa Bartlett seen by RD 11/28- Vanessa Bartlett met criteria for severe malnutrition as Vanessa Bartlett had 14% weight loss and Vanessa Bartlett was provided with Ensure Complete and Magic Cup supplements. Vanessa Bartlett's usual body weight is 132 lbs. Weight history shows 7% weight loss in less than 2 weeks.   Height: Ht Readings from Last 1 Encounters:  08/15/13 5\' 6"  (1.676 m)    Weight: Wt Readings from Last 1 Encounters:  08/13/13 113 lb 12.8 oz (51.619 kg)    Ideal Body Weight: 130 lbs  % Ideal Body Weight: 87%  Wt Readings from Last 10 Encounters:  08/13/13 113 lb 12.8 oz (51.619 kg)  08/04/13 122 lb 9.6 oz (55.611 kg)  05/17/13 121 lb (54.885 kg)  02/22/13 122 lb (55.339 kg)  01/29/12 133 lb (60.328 kg)   09/19/12 129 lb (58.514 kg)  08/20/11 136 lb (61.689 kg)  07/09/10 137 lb (62.143 kg)  06/20/10 67 lb (30.391 kg)    Usual Body Weight: 132 lbs  % Usual Body Weight: 86%  BMI:  18.2 kg/(m^2) based on weight from 08/13/13  Estimated Nutritional Needs: Kcal: 1460-1680 Protein: 60-170 grams Fluid: 1.5-1.7 L/day  Skin: WDL  Diet Order: Dysphagia  EDUCATION NEEDS: -No education needs identified at this time   Intake/Output Summary (Last 24 hours) at 08/15/13 1322 Last data filed at 08/15/13 0900  Gross per 24 hour  Intake      0 ml  Output      0 ml  Net      0 ml    Last BM: PTA  Labs:   Recent Labs Lab 08/11/13 0505 08/12/13 0610 08/15/13 0340  NA 148* 142 135  K 4.1 3.9 3.6  CL 113* 108 99  CO2 28 27 22   BUN 20 15 16   CREATININE 1.47* 1.26* 1.30*  CALCIUM 9.1 8.7 9.9  MG  --  2.0  --   GLUCOSE 95 90 147*    CBG (last 3)   Recent Labs  08/13/13 0008 08/13/13 0448 08/13/13 0802  GLUCAP 112* 95 117*    Scheduled Meds: . atorvastatin  10 mg Oral q1800  . feeding supplement (ENSURE COMPLETE)  237 mL Oral BID BM  . midodrine  5 mg Oral TID WC  . sertraline  100 mg Oral Daily  . sodium chloride  3 mL Intravenous Q12H    Continuous Infusions:   Past Medical History  Diagnosis Date  . RHEUMATIC HEART DISEASE   . HYPERTENSION   . MITRAL VALVE REPLACEMENT, HX OF   . DEPRESSION   . AORTIC VALVE REPLACEMENT, HX OF   . Headache   . Hx of vertigo   . Chronic low back pain   . GERD (gastroesophageal reflux disease)   . Osteoporosis   . Dyslipidemia   . Cerebrovascular disease     with left brain stroke, residual aphasia  . Orthostatic hypotension 02/22/2013  . Restless legs syndrome (RLS) 02/22/2013    Past Surgical History  Procedure Laterality Date  . Aortic valve replacement    . Mitral valve replacement 1990    . Lumbar disc surgery    . Arthroscopic  knee  surgery    . Cholecystectomy    . Dilation and curettage of uterus    .  Tubal ligation    . Right knee surgery in 1993.    . Abdominal hysterectomy    . Cataract extraction Bilateral   . Tonsillectomy    . Appendectomy      Ian Malkin RD, LDN Inpatient Clinical Dietitian Pager: [phone] After Hours Pager: [phone]

## 2013-08-15 NOTE — ED Provider Notes (Signed)
CSN: [id number]     Arrival date & time 08/15/13  0306 History   First MD Initiated Contact with Patient 08/15/13 0401     Chief Complaint  Patient presents with  . Aggressive Behavior   Level V caveat for altered mental status  (Consider location/radiation/quality/duration/timing/severity/associated sxs/prior Treatment) HPI Son reports patient was living at home and doing well. However she was admitted November 21 and had confusion and a urinary tract infection. He states she was discharged to a rehabilitation facility however she had to be readmitted on November 26 for delirium. He states she had not been eating and was felt to be dehydrated. They also felt she had a reaction to the Cipro that she was being treated for her UTI with. He states in the hospital they gave her IV fluids and IV Rocephin and she slowly improved. At time of discharge she was more her normal self. He states she was discharged home with a walker because she was weak. He states however since she's been home which is only 2 days she has quit eating and he denied she started calling out and being aggressive to her family at home to  PCP Dr Renne Crigler  Past Medical History  Diagnosis Date  . RHEUMATIC HEART DISEASE   . HYPERTENSION   . MITRAL VALVE REPLACEMENT, HX OF   . DEPRESSION   . AORTIC VALVE REPLACEMENT, HX OF   . Headache   . Hx of vertigo   . Chronic low back pain   . GERD (gastroesophageal reflux disease)   . Osteoporosis   . Dyslipidemia   . Cerebrovascular disease     with left brain stroke, residual aphasia  . Orthostatic hypotension 02/22/2013  . Restless legs syndrome (RLS) 02/22/2013   Past Surgical History  Procedure Laterality Date  . Aortic valve replacement    . Mitral valve replacement 1990    . Lumbar disc surgery    . Arthroscopic  knee  surgery    . Cholecystectomy    . Dilation and curettage of uterus    . Tubal ligation    . Right knee surgery in 1993.    . Abdominal hysterectomy     . Cataract extraction Bilateral   . Tonsillectomy    . Appendectomy     Family History  Problem Relation Age of Onset  . Cancer    . Heart attack    . Diabetes    . Cancer Mother     esophageal cancer  . Cancer Father     Throat cancer  . Diabetes Sister     Kidney Cancer  . Heart disease Brother     Lung Cancer  . Aneurysm Brother     Abdominal aortic aneurysm  . Cancer Brother     lung  . Cancer Sister     Breast cancer   History  Substance Use Topics  . Smoking status: Current Every Day Smoker -- 0.30 packs/day  . Smokeless tobacco: Never Used  . Alcohol Use: No   Patient was living at home taking care of herself until November 21. She currently is staying with her sons.   OB History   Grav Para Term Preterm Abortions TAB SAB Ect Mult Living                 Review of Systems  All other systems reviewed and are negative.    Allergies  Codeine  Home Medications   Current Outpatient Rx  Name  Route  Sig  Dispense  Refill  . amoxicillin (AMOXIL) 500 MG capsule   Oral   Take 1 capsule (500 mg total) by mouth every 8 (eight) hours.   12 capsule   0   . atorvastatin (LIPITOR) 10 MG tablet   Oral   Take 10 mg by mouth daily.         . Calcium Carbonate-Vitamin D (CALCIUM 500 + D PO)   Oral   Take 500 mg by mouth 2 (two) times daily.         . feeding supplement, ENSURE COMPLETE, (ENSURE COMPLETE) LIQD   Oral   Take 237 mLs by mouth 2 (two) times daily between meals.         . lidocaine (LIDODERM) 5 %   Transdermal   Place 1 patch onto the skin daily. Remove & Discard patch within 12 hours or as directed by MD   30 patch   0   . midodrine (PROAMATINE) 5 MG tablet   Oral   Take 1 tablet (5 mg total) by mouth 3 (three) times daily with meals.         . pramipexole (MIRAPEX) 0.25 MG tablet   Oral   Take 0.25 mg by mouth at bedtime.         . sertraline (ZOLOFT) 100 MG tablet   Oral   Take 100 mg by mouth daily.         Marland Kitchen  warfarin (COUMADIN) 2.5 MG tablet   Oral   Take 2.5 mg by mouth every Monday, Wednesday, and Friday.          . warfarin (COUMADIN) 5 MG tablet   Oral   Take 5 mg by mouth every Tuesday, Thursday, Saturday, and Sunday at 6 PM. Pt takes on Tues Thurs, Sat, Sun         . LORazepam (ATIVAN) 0.5 MG tablet   Oral   Take 0.5 mg by mouth 4 (four) times daily as needed for anxiety.         . nitroGLYCERIN (NITROSTAT) 0.4 MG SL tablet   Sublingual   Place 0.4 mg under the tongue every 5 (five) minutes as needed for chest pain.           BP 160/78  Pulse 93  Temp(Src) 98.4 F (36.9 C) (Rectal)  Resp 16  SpO2 98%  Vital signs normal   Physical Exam  Nursing note and vitals reviewed. Constitutional:  Non-toxic appearance. She does not appear ill. No distress.  Elderly female who is screaming out for her son who was sitting next to her at the bedside  HENT:  Head: Normocephalic and atraumatic.  Right Ear: External ear normal.  Left Ear: External ear normal.  Nose: Nose normal. No mucosal edema or rhinorrhea.  Mouth/Throat: Mucous membranes are normal. No dental abscesses or uvula swelling.  Tongue dry, patient edentulous  Eyes: Conjunctivae and EOM are normal. Pupils are equal, round, and reactive to light.  Pupils dilated  Neck: Normal range of motion and full passive range of motion without pain. Neck supple.  Cardiovascular: Normal rate, regular rhythm and normal heart sounds.  Exam reveals no gallop and no friction rub.   No murmur heard. Pulmonary/Chest: Effort normal and breath sounds normal. No respiratory distress. She has no wheezes. She has no rhonchi. She has no rales. She exhibits no tenderness and no crepitus.  Abdominal: Soft. Normal appearance and bowel sounds are normal. She exhibits no distension. There is no tenderness.  There is no rebound and no guarding.  Musculoskeletal: Normal range of motion. She exhibits no edema and no tenderness.  Moves all  extremities well.   Neurological: She is alert. She has normal strength. No cranial nerve deficit.  Skin: Skin is warm, dry and intact. No rash noted. No erythema. No pallor.  Psychiatric: Her speech is rapid and/or pressured. She is agitated.  Patient appears to be having visual and auditory hallucinations    ED Course  Procedures (including critical care time)  Medications  LORazepam (ATIVAN) injection 0.5 mg (0.5 mg Intravenous Given 08/15/13 0439)  haloperidol lactate (HALDOL) injection 2 mg (2 mg Intravenous Given 08/15/13 0434)   Patient was given Ativan and Haldol 2 to actively screaming out and talking to people who aren't there and reaching for things that aren't there.  04:48 Dr Adela Glimpse, will see patient to decide bed assignment  06:15 Dr Adela Glimpse wants me to admit to tele and do initial holding orders.   Labs Review Results for orders placed during the hospital encounter of 08/15/13  CBC WITH DIFFERENTIAL      Result Value Range   WBC 8.2  4.0 - 10.5 K/uL   RBC 3.79 (*) 3.87 - 5.11 MIL/uL   Hemoglobin 11.3 (*) 12.0 - 15.0 g/dL   HCT 91.4 (*) 78.2 - 95.6 %   MCV 88.4  78.0 - 100.0 fL   MCH 29.8  26.0 - 34.0 pg   MCHC 33.7  30.0 - 36.0 g/dL   RDW 21.3  08.6 - 57.8 %   Platelets 233  150 - 400 K/uL   Neutrophils Relative % 86 (*) 43 - 77 %   Neutro Abs 7.0  1.7 - 7.7 K/uL   Lymphocytes Relative 7 (*) 12 - 46 %   Lymphs Abs 0.6 (*) 0.7 - 4.0 K/uL   Monocytes Relative 8  3 - 12 %   Monocytes Absolute 0.6  0.1 - 1.0 K/uL   Eosinophils Relative 0  0 - 5 %   Eosinophils Absolute 0.0  0.0 - 0.7 K/uL   Basophils Relative 0  0 - 1 %   Basophils Absolute 0.0  0.0 - 0.1 K/uL  COMPREHENSIVE METABOLIC PANEL      Result Value Range   Sodium 135  135 - 145 mEq/L   Potassium 3.6  3.5 - 5.1 mEq/L   Chloride 99  96 - 112 mEq/L   CO2 22  19 - 32 mEq/L   Glucose, Bld 147 (*) 70 - 99 mg/dL   BUN 16  6 - 23 mg/dL   Creatinine, Ser 4.69 (*) 0.50 - 1.10 mg/dL   Calcium 9.9  8.4 -  62.9 mg/dL   Total Protein 7.6  6.0 - 8.3 g/dL   Albumin 3.5  3.5 - 5.2 g/dL   AST 52 (*) 0 - 37 U/L   ALT 20  0 - 35 U/L   Alkaline Phosphatase 141 (*) 39 - 117 U/L   Total Bilirubin 0.5  0.3 - 1.2 mg/dL   GFR calc non Af Amer 41 (*) >90 mL/min   GFR calc Af Amer 47 (*) >90 mL/min  URINALYSIS, ROUTINE W REFLEX MICROSCOPIC      Result Value Range   Color, Urine YELLOW  YELLOW   APPearance CLEAR  CLEAR   Specific Gravity, Urine 1.023  1.005 - 1.030   pH 6.0  5.0 - 8.0   Glucose, UA NEGATIVE  NEGATIVE mg/dL   Hgb urine dipstick TRACE (*)  NEGATIVE   Bilirubin Urine SMALL (*) NEGATIVE   Ketones, ur NEGATIVE  NEGATIVE mg/dL   Protein, ur 30 (*) NEGATIVE mg/dL   Urobilinogen, UA 1.0  0.0 - 1.0 mg/dL   Nitrite NEGATIVE  NEGATIVE   Leukocytes, UA NEGATIVE  NEGATIVE  URINE MICROSCOPIC-ADD ON      Result Value Range   Squamous Epithelial / LPF RARE  RARE   WBC, UA 0-2  <3 WBC/hpf   RBC / HPF 0-2  <3 RBC/hpf   Bacteria, UA FEW (*) RARE   Casts HYALINE CASTS (*) NEGATIVE  PROTIME-INR      Result Value Range   Prothrombin Time 37.1 (*) 11.6 - 15.2 seconds   INR 3.95 (*) 0.00 - 1.49   Laboratory interpretation all normal except for mild anemia, worsening renal insufficiency, over therapeutic Coumadin    Imaging Review  Dg Chest 1 View  08/09/2013 .  IMPRESSION: There is no evidence of pneumonia nor definite evidence of CHF. There are mildly increased interstitial markings in both lungs but these are of uncertain etiology and duration. This signed report   Electronically Signed   By: David  [place]   On: 08/09/2013 18:59   Ct Head Wo Contrast  08/11/2013   .  IMPRESSION: Stable cerebral atrophy.  No acute abnormality.   Electronically Signed   By: Maisie Fus  Register   On: 08/11/2013 17:01   Ct Head Wo Contrast  08/09/2013   IMPRESSION: No acute intracranial abnormality.  No significant change.   Electronically Signed   By: Natasha Mead M.D.   On: 08/09/2013 18:53   Ct Head Wo  Contrast  08/04/2013    IMPRESSION: No acute findings.   Electronically Signed   By: Esperanza Heir M.D.   On: 08/04/2013 17:13   No results found.  EKG Interpretation   None      Date: 08/15/2013  Rate: 114  Rhythm: sinus tachycardia  QRS Axis: normal  Intervals: normal  ST/T Wave abnormalities: normal  Conduction Disutrbances:RVH  Narrative Interpretation:   Old EKG Reviewed: none available    MDM   1. Delirium    Plan admission  Devoria Albe, MD, Franz Dell, MD 08/15/13 [phone]

## 2013-08-15 NOTE — H&P (Addendum)
[facility] History and Physical  Vanessa Bartlett BJS:[id number] DOB: [birth year] DOA: 08/15/2013  Referring physician: Devoria Albe, ER physician PCP: Londell Moh, MD  Specialists: Palliative care  Chief Complaint: Agitation  HPI: Vanessa Bartlett is a 72 y.o. female  Just discharged from the hospital service last week for episodes of agitation and hallucinations felt to be in part from dementia but also exacerbated by possible medication including antibiotics. Patient was improved and she was discharged home to care of her son after consultation out of care.  Patient was home for only a few days and started having decreased by mouth increased agitation and violent outbursts. Patient's son who is a very thorough caregiver apparently, reported a fever last night. Patient brought into the emergency room. Lab work was unremarkable noting actually improvement in hemoglobin, renal function and electrolytes. White count was normal and patient's INR is the only abnormal lab which was slightly supratherapeutic. Repeat urinalysis was normal. Chest x-ray not done. Hospitalists were called for further evaluation and admission.  Review of Systems: Patient seen in room, received dose of Ativan for agitation. Unable to give me any kind of review of systems  Past Medical History  Diagnosis Date  . RHEUMATIC HEART DISEASE   . HYPERTENSION   . MITRAL VALVE REPLACEMENT, HX OF   . DEPRESSION   . AORTIC VALVE REPLACEMENT, HX OF   . Headache   . Hx of vertigo   . Chronic low back pain   . GERD (gastroesophageal reflux disease)   . Osteoporosis   . Dyslipidemia   . Cerebrovascular disease     with left brain stroke, residual aphasia  . Orthostatic hypotension 02/22/2013  . Restless legs syndrome (RLS) 02/22/2013   Past Surgical History  Procedure Laterality Date  . Aortic valve replacement    . Mitral valve replacement 1990    . Lumbar disc surgery    . Arthroscopic  knee  surgery    .  Cholecystectomy    . Dilation and curettage of uterus    . Tubal ligation    . Right knee surgery in 1993.    . Abdominal hysterectomy    . Cataract extraction Bilateral   . Tonsillectomy    . Appendectomy     Social History:  reports that she has been smoking.  She has never used smokeless tobacco. She reports that she does not drink alcohol or use illicit drugs. Patient was at home with her son for the last few days, she is able to get up with assistance  Allergies  Allergen Reactions  . Codeine     Family History  Problem Relation Age of Onset  . Cancer    . Heart attack    . Diabetes    . Cancer Mother     esophageal cancer  . Cancer Father     Throat cancer  . Diabetes Sister     Kidney Cancer  . Heart disease Brother     Lung Cancer  . Aneurysm Brother     Abdominal aortic aneurysm  . Cancer Brother     lung  . Cancer Sister     Breast cancer    Prior to Admission medications   Medication Sig Start Date End Date Taking? Authorizing Provider  amoxicillin (AMOXIL) 500 MG capsule Take 1 capsule (500 mg total) by mouth every 8 (eight) hours. 08/13/13  Yes Catarina Hartshorn, MD  atorvastatin (LIPITOR) 10 MG tablet Take 10 mg by mouth daily. 05/16/13  Yes Historical Provider, MD  Calcium Carbonate-Vitamin D (CALCIUM 500 + D PO) Take 500 mg by mouth 2 (two) times daily.   Yes Historical Provider, MD  feeding supplement, ENSURE COMPLETE, (ENSURE COMPLETE) LIQD Take 237 mLs by mouth 2 (two) times daily between meals. 08/07/13  Yes Shanker Levora Dredge, MD  lidocaine (LIDODERM) 5 % Place 1 patch onto the skin daily. Remove & Discard patch within 12 hours or as directed by MD 08/07/13  Yes Shanker Levora Dredge, MD  midodrine (PROAMATINE) 5 MG tablet Take 1 tablet (5 mg total) by mouth 3 (three) times daily with meals. 08/07/13  Yes Shanker Levora Dredge, MD  pramipexole (MIRAPEX) 0.25 MG tablet Take 0.25 mg by mouth at bedtime.   Yes Historical Provider, MD  sertraline (ZOLOFT) 100 MG tablet  Take 100 mg by mouth daily. 04/24/13  Yes Historical Provider, MD  warfarin (COUMADIN) 2.5 MG tablet Take 2.5 mg by mouth every Monday, Wednesday, and Friday.    Yes Historical Provider, MD  warfarin (COUMADIN) 5 MG tablet Take 5 mg by mouth every Tuesday, Thursday, Saturday, and Sunday at 6 PM. Pt takes on Tues Thurs, Sat, Sun   Yes Historical Provider, MD  LORazepam (ATIVAN) 0.5 MG tablet Take 0.5 mg by mouth 4 (four) times daily as needed for anxiety.    Historical Provider, MD  nitroGLYCERIN (NITROSTAT) 0.4 MG SL tablet Place 0.4 mg under the tongue every 5 (five) minutes as needed for chest pain.     Historical Provider, MD   Physical Exam: Filed Vitals:   08/15/13 0755  BP: 95/68  Pulse: 98  Temp: 97.8 F (36.6 C)  Resp: 20     General:  Sedated, easily agitated, unable to converse  Eyes: Sclera appear to be nonicteric, unable to check ocular movements  ENT: Normocephalic, atraumatic, mucous membranes are dry  Neck: Supple, no JVD  Cardiovascular: Regular rhythm, with a loud S2 and mild tachycardia  Respiratory: Clear to auscultation bilaterally  Abdomen: Soft, nontender, nondistended, positive bowel sounds  Skin: Skin breaks, tears or lesions, no evidence of cellulitis. Patient is a small scab on her left side from a previous cut, but no surrounding erythema   Musculoskeletal: No clubbing cyanosis or edema  Psychiatric: Patient easily agitated  Neurologic: Unable to check focal exam, no obvious deficits  Labs on Admission:  Basic Metabolic Panel:  Recent Labs Lab 08/09/13 1720 08/10/13 1035 08/11/13 0505 08/12/13 0610 08/15/13 0340  NA 143 144 148* 142 135  K 4.4 3.5 4.1 3.9 3.6  CL 104 107 113* 108 99  CO2 19 24 28 27 22   GLUCOSE 75 78 95 90 147*  BUN 28* 24* 20 15 16   CREATININE 1.78* 1.48* 1.47* 1.26* 1.30*  CALCIUM 9.7 8.9 9.1 8.7 9.9  MG  --   --   --  2.0  --    Liver Function Tests:  Recent Labs Lab 08/09/13 1720 08/10/13 1035  08/11/13 0505 08/15/13 0340  AST 139* 133* 101* 52*  ALT 23 22 21 20   ALKPHOS 135* 113 117 141*  BILITOT 0.5 0.4 0.2* 0.5  PROT 7.2 6.2 6.1 7.6  ALBUMIN 3.4* 2.9* 2.7* 3.5   No results found for this basename: LIPASE, AMYLASE,  in the last 168 hours  Recent Labs Lab 08/09/13 1958  AMMONIA 18   CBC:  Recent Labs Lab 08/09/13 1720 08/11/13 0505 08/15/13 0340  WBC 10.7* 4.3 8.2  NEUTROABS  --   --  7.0  HGB 10.6*  9.4* 11.3*  HCT 32.5* 29.6* 33.5*  MCV 93.1 94.6 88.4  PLT 183 173 233   Cardiac Enzymes:  Recent Labs Lab 08/10/13 1035 08/12/13 0610  CKTOTAL 1447* 132    BNP (last 3 results) No results found for this basename: PROBNP,  in the last 8760 hours CBG:  Recent Labs Lab 08/12/13 1657 08/12/13 2006 08/13/13 0008 08/13/13 0448 08/13/13 0802  GLUCAP 98 138* 112* 95 117*    Radiological Exams on Admission: No results found.  EKG: Independently reviewed. Sinus tachycardia with right ventricular hypertrophy  Assessment/Plan Principal Problem:   Dementia with behavioral disturbance: There may be an underlying infection. Patient reportedly had fevers, although nothing documented here. Normal white count. Checking chest x-rays aspiration risk is a possibility. Given review of patient's CT which is severe atrophy of the brain, likely this is some component of end-stage dementia. Discussed with patient's son and discuss chest x-ray, will check BNP to ensure no volume overload. Have also asked hospice for consult. Otherwise, patient's labs are actually improved including hemoglobin, renal function and electrolytes since previous admission. UA unremarkable Active Problems:   TOBACCO ABUSE    DEPRESSION: Continue Zoloft    HYPERTENSION: Stable, continue antihypertensives    MITRAL VALVE REPLACEMENT, HX OF   AORTIC VALVE REPLACEMENT, HX OF colon pharmacy to dose Coumadin, noted slightly supratherapeutic INR    Orthostatic hypotension: Continue Midorine     Restless legs syndrome (RLS): Holding Mirapex as it can cause problems with hallucinations    Dysphagia, unspecified(787.20): Dysphagia 2 diet as recommended by speech therapy last admission    Protein-calorie malnutrition, severe: Patient again taking poor by mouth. We'll try appetite stimulant      Agitation: See above. Meantime control with when necessary Ativan  Addendum: BNP moderately elevated at 1700 and chest x-ray consistent with signs of pneumonia, suspect aspiration even mild. BNP in part elevated from mild renal dysfunction and pneumonia. We'll try to treat one dose of IV Lasix rather than prolonged therapy. Will also start by mouth Levaquin 750 q. 48 hours.    Code Status: Son confirmed DO NOT RESUSCITATE  Family Communication: Spoke with patient's son who is primary caregiver plus other family members at the bedside  Disposition Plan: Depending on hospice meeting  Time spent: 35 minutes  Hollice Espy [facility] Pager [phone]  If 7PM-7AM, please contact night-coverage [email or web address] Password [facility] 08/15/2013, 11:51 AM      Is a

## 2013-08-15 NOTE — ED Notes (Signed)
Per EMS report: pt from home: Pt was recently discharged from [facility] 2 days ago for a UTI and Alt Mental Status.  Pt became altered today and being very aggressive towards family.  Pt has not been eating or drinking well.  10 days ago and prior to admission to the hospital, pt was living independently. Skin warm and dry.

## 2013-08-15 NOTE — Progress Notes (Signed)
ANTICOAGULATION CONSULT NOTE - Initial Consult  Pharmacy Consult for Coumadin Indication: AVR/MVR  Allergies  Allergen Reactions  . Codeine     Patient Measurements: Height: 5\' 6"  (167.6 cm) IBW/kg (Calculated) : 59.3 Heparin Dosing Weight:   Vital Signs: Temp: 97.8 F (36.6 C) (12/02 0755) Temp src: Axillary (12/02 0755) BP: 95/68 mmHg (12/02 0755) Pulse Rate: 98 (12/02 0755)  Labs:  Recent Labs  08/13/13 0508 08/15/13 0340  HGB  --  11.3*  HCT  --  33.5*  PLT  --  233  LABPROT 24.1* 37.1*  INR 2.24* 3.95*  CREATININE  --  1.30*    The CrCl is unknown because both a height and weight (above a minimum accepted value) are required for this calculation.   Medical History: Past Medical History  Diagnosis Date  . RHEUMATIC HEART DISEASE   . HYPERTENSION   . MITRAL VALVE REPLACEMENT, HX OF   . DEPRESSION   . AORTIC VALVE REPLACEMENT, HX OF   . Headache   . Hx of vertigo   . Chronic low back pain   . GERD (gastroesophageal reflux disease)   . Osteoporosis   . Dyslipidemia   . Cerebrovascular disease     with left brain stroke, residual aphasia  . Orthostatic hypotension 02/22/2013  . Restless legs syndrome (RLS) 02/22/2013   Assessment: Vanessa Bartlett on coumadin PTA for hx AVR/MVR and hx dementia admitted 12/2 with agitation. Pt had similar recent admission for episodes of agitation and hallucinations.  Home dose coumadin reported as 5mg  daily except 2.5mg  MWF. Last dose 12/1. Pharmacy consulted to resume coumadin.    12/2:  Noted during last admission, pt received cipro for UTI and INR rose to 3.95.  Abx then changed to amoxicillin.  On admission, INR again supratherapeutic at 3.95.    CBC: Hgb 11.3, plts ok.    Weight = 51.6 on 11/3  Diet: Dysphagia 2  Renal function: Appears pt has CKD?, today SCr 1.30, stable.    Goal of Therapy:  INR 2.5 to 3.5 Monitor platelets by anticoagulation protocol: Yes   Plan:  1.  Hold coumadin today 2.  F/u daily  PT/INR  Haynes Hoehn, PharmD, BCPS 08/15/2013, 12:17 PM  Pager: [phone]

## 2013-08-15 NOTE — Progress Notes (Signed)
[facility]  Patient Status: Active (receiving services up to time of hospitalization)  [facility] is providing the following services: RN, PT, OT, ST and MSW  If patient discharges after hours, please call [phone].   Vanessa Bartlett 08/15/2013, 10:58 AM

## 2013-08-15 NOTE — Progress Notes (Signed)
Pt had not voided since 0730 this am. Pt was bladder scanned, and of urine was found to be in bladder. Pt verbalized needing to go to the bathroom, and attempted to use the bedpan multiple times unsuccessfully. MD was paged. VO's were taken to place and I&O cath x 1. Pt was cathed, and of urine was relieved from bladder. Will continue to monitor pt and encourage use of bedpan.

## 2013-08-15 NOTE — Progress Notes (Signed)
Palliative Medicine Team consult to discuss hospice options, re-address goals received; spoke with son Melodie Ashworth meeting time confirmed for tomorrow after he gets off work Wednesday 08/16/13 @ 4:00 pm    Valente David, RN 08/15/2013, 11:58 AM Palliative Medicine Team RN Liaison [phone]

## 2013-08-16 ENCOUNTER — Encounter: Payer: Self-pay | Admitting: Internal Medicine

## 2013-08-16 DIAGNOSIS — R404 Transient alteration of awareness: Secondary | ICD-10-CM

## 2013-08-16 DIAGNOSIS — Z515 Encounter for palliative care: Secondary | ICD-10-CM

## 2013-08-16 LAB — BASIC METABOLIC PANEL
Calcium: 9.7 mg/dL (ref 8.4–10.5)
Chloride: 101 mEq/L (ref 96–112)
Creatinine, Ser: 1.33 mg/dL — ABNORMAL HIGH (ref 0.50–1.10)
GFR calc Af Amer: 46 mL/min — ABNORMAL LOW (ref >90)
Glucose, Bld: 82 mg/dL (ref 70–99)
Potassium: 3.4 mEq/L — ABNORMAL LOW (ref 3.5–5.1)
Sodium: 138 mEq/L (ref 135–145)

## 2013-08-16 LAB — PROTIME-INR: INR: 4.66 — ABNORMAL HIGH (ref 0.00–1.49)

## 2013-08-16 LAB — CULTURE, BLOOD (SINGLE)

## 2013-08-16 LAB — CBC
Platelets: 224 10*3/uL (ref 150–400)
RBC: 3.77 MIL/uL — ABNORMAL LOW (ref 3.87–5.11)
RDW: 14.4 % (ref 11.5–15.5)
WBC: 6.1 10*3/uL (ref 4.0–10.5)

## 2013-08-16 MED ORDER — LORAZEPAM 2 MG/ML IJ SOLN
1.0000 mg | INTRAMUSCULAR | Status: DC | PRN
  Administered 2013-08-16: 1 mg via INTRAVENOUS
  Filled 2013-08-16: qty 1

## 2013-08-16 MED ORDER — WARFARIN - PHARMACIST DOSING INPATIENT: Freq: Every day | Status: DC

## 2013-08-16 MED ORDER — MORPHINE SULFATE (CONCENTRATE) 10 MG /0.5 ML PO SOLN: 10.0000 mg | ORAL | Status: DC | PRN

## 2013-08-16 MED ORDER — LORAZEPAM 2 MG/ML PO CONC: 1.0000 mg | ORAL | Status: DC | PRN

## 2013-08-16 MED ORDER — POTASSIUM CHLORIDE CRYS ER 20 MEQ PO TBCR
30.0000 meq | EXTENDED_RELEASE_TABLET | Freq: Once | ORAL | Status: DC
  Filled 2013-08-16: qty 1

## 2013-08-16 NOTE — Progress Notes (Signed)
UR completed. Patient changed to inpatient status r/t requiring IV lasix.

## 2013-08-16 NOTE — Progress Notes (Signed)
Met with patient's son Casimiro Needle to further discuss goals of care.  1. DNR 2. Home with Hospice when medical stable. 3. Continue coumadin for her valves until she is no longer able to take PO.  Anderson Malta, DO Palliative Medicine'

## 2013-08-16 NOTE — Progress Notes (Signed)
[facility] PROGRESS NOTE      Vanessa Bartlett:[id number] DOB: Jan 07, 1941 DOA: 08/15/2013 PCP: Londell Moh, MD  Assessment/Plan:  Dementia with behavioral disturbance: There may be an underlying infection. Patient reportedly had fevers, although nothing documented here. Normal white count. Checking chest x-rays aspiration risk is a possibility. Given review of patient's CT which is severe atrophy of the brain, likely this is some component of end-stage dementia. Discussed with patient's son and discuss chest x-ray, will check BNP to ensure no volume overload. Have also asked hospice for consult. Otherwise, patient's labs are actually improved including hemoglobin, renal function and electrolytes since previous admission. UA unremarkable  TOBACCO ABUSE  DEPRESSION: Continue Zoloft  HYPERTENSION: Stable, continue antihypertensives  MITRAL VALVE REPLACEMENT, HX OF  AORTIC VALVE REPLACEMENT, HX OF pharmacy to dose Coumadin Orthostatic hypotension: Continue Midorine  Restless legs syndrome (RLS): Holding Mirapex as it can cause problems with hallucinations  Dysphagia, unspecified(787.20): Dysphagia 2 diet as recommended by speech therapy last admission  Protein-calorie malnutrition, severe: Patient again taking poor by mouth. Agitation: See above. Meantime control with when necessary Ativan  BNP elevation at 1700 and chest x-ray consistent with signs of pneumonia,  - continue Lasix today. On Levaquin. Clinically monitor.   Diet: Dysphagia 2 Fluids: none DVT Prophylaxis: On Coumadin  Code Status: DO NOT RESUSCITATE Family Communication: None  Disposition Plan: Inpatient, pending palliative care consult for goals of care  Consultants:  Palliative  Procedures:  None   Antibiotics  Anti-infectives   Start     Dose/Rate Route Frequency Ordered Stop   08/15/13 1800  levofloxacin (LEVAQUIN) tablet 750 mg     750 mg Oral Every 48 hours 08/15/13 1625        Antibiotics Given (last 72 hours)   None     HPI/Subjective: - Confused this morning, alert  Objective: Filed Vitals:   08/15/13 0757 08/15/13 1700 08/15/13 2225 08/16/13 0603  BP:  146/72 127/52 111/44  Pulse:  99 99 98  Temp:  97.2 F (36.2 C) 98.1 F (36.7 C) 97.5 F (36.4 C)  TempSrc:  Axillary Oral Oral  Resp:  20 20 18   Height: 5\' 6"  (1.676 m)     Weight: 51.256 kg (113 lb)     SpO2:  100% 98% 98%    Intake/Output Summary (Last 24 hours) at 08/16/13 0824 Last data filed at 08/16/13 0647  Gross per 24 hour  Intake    220 ml  Output   3650 ml  Net  -3430 ml   Filed Weights   08/15/13 0757  Weight: 51.256 kg (113 lb)    Exam:   General:  NAD  Cardiovascular: regular rate and rhythm, without MRG  Respiratory: good air movement, clear to auscultation throughout, no wheezing, ronchi or rales  Abdomen: soft, not tender to palpation, positive bowel sounds  MSK: no edema  Neuro: Grossly nonfocal  Data Reviewed: Basic Metabolic Panel:  Recent Labs Lab 08/10/13 1035 08/11/13 0505 08/12/13 0610 08/15/13 0340 08/16/13 0408  NA 144 148* 142 135 138  K 3.5 4.1 3.9 3.6 3.4*  CL 107 113* 108 99 101  CO2 24 28 27 22 24   GLUCOSE 78 95 90 147* 82  BUN 24* 20 15 16 15   CREATININE 1.48* 1.47* 1.26* 1.30* 1.33*  CALCIUM 8.9 9.1 8.7 9.9 9.7  MG  --   --  2.0  --   --    Liver Function Tests:  Recent Labs Lab 08/09/13 1720 08/10/13 1035  08/11/13 0505 08/15/13 0340  AST 139* 133* 101* 52*  ALT 23 22 21 20   ALKPHOS 135* 113 117 141*  BILITOT 0.5 0.4 0.2* 0.5  PROT 7.2 6.2 6.1 7.6  ALBUMIN 3.4* 2.9* 2.7* 3.5   No results found for this basename: LIPASE, AMYLASE,  in the last 168 hours  Recent Labs Lab 08/09/13 1958  AMMONIA 18   CBC:  Recent Labs Lab 08/09/13 1720 08/11/13 0505 08/15/13 0340 08/16/13 0408  WBC 10.7* 4.3 8.2 6.1  NEUTROABS  --   --  7.0  --   HGB 10.6* 9.4* 11.3* 10.9*  HCT 32.5* 29.6* 33.5* 34.1*  MCV 93.1 94.6  88.4 90.5  PLT 183 173 233 224   Cardiac Enzymes:  Recent Labs Lab 08/10/13 1035 08/12/13 0610  CKTOTAL 1447* 132   BNP (last 3 results)  Recent Labs  08/15/13 0340  PROBNP 1786.0*   CBG:  Recent Labs Lab 08/12/13 1657 08/12/13 2006 08/13/13 0008 08/13/13 0448 08/13/13 0802  GLUCAP 98 138* 112* 95 117*    Recent Results (from the past 240 hour(s))  CULTURE, BLOOD (SINGLE)     Status: None   Collection Time    08/09/13  7:55 PM      Result Value Range Status   Specimen Description BLOOD RIGHT ANTECUBITAL   Final   Special Requests BOTTLES DRAWN AEROBIC ONLY 10CC   Final   Culture  Setup Time     Final   Value: 08/10/2013 02:55     Performed at [facility]   Culture     Final   Value: NO GROWTH 5 DAYS     Performed at [facility]   Report Status 08/16/2013 FINAL   Final  MRSA PCR SCREENING     Status: None   Collection Time    08/10/13  2:49 AM      Result Value Range Status   MRSA by PCR NEGATIVE  NEGATIVE Final   Comment:            The GeneXpert MRSA Assay (FDA     approved for NASAL specimens     only), is one component of a     comprehensive MRSA colonization     surveillance program. It is not     intended to diagnose MRSA     infection nor to guide or     monitor treatment for     MRSA infections.     Studies: Portable Chest 1 View  08/15/2013   CLINICAL DATA:  Altered mental status.  EXAM: PORTABLE CHEST - 1 VIEW  COMPARISON:  Chest radiograph 08/09/2013  FINDINGS: Stable cardiac and mediastinal contours given differences in patient positioning. Calcification of the thoracic aorta. No significant interval change bilateral interstitial opacities. Consolidative heterogeneous opacity within the retrocardiac region. No pleural effusion or pneumothorax. Regional skeleton is unremarkable.  IMPRESSION: 1. Heterogeneous retrocardiac opacities which may represent infection in the appropriate clinical setting. Continued radiographic  followup to ensure resolution is recommended. 2. Unchanged bilateral interstitial opacities which are of uncertain chronicity and may represent an acute or chronic process.   Electronically Signed   By: Annia Belt M.D.   On: 08/15/2013 16:12    Scheduled Meds: . atorvastatin  10 mg Oral q1800  . feeding supplement (ENSURE COMPLETE)  237 mL Oral BID BM  . furosemide  40 mg Intravenous BID  . levofloxacin  750 mg Oral Q48H  . midodrine  5 mg Oral TID WC  .  sertraline  100 mg Oral Daily   Continuous Infusions:   Principal Problem:   Dementia with behavioral disturbance Active Problems:   TOBACCO ABUSE   DEPRESSION   HYPERTENSION   MITRAL VALVE REPLACEMENT, HX OF   AORTIC VALVE REPLACEMENT, HX OF   Orthostatic hypotension   Restless legs syndrome (RLS)   Dysphagia, unspecified(787.20)   Protein-calorie malnutrition, severe   Delirium   Agitation   Volume overload   Aspiration pneumonia  Time spent: 25  Pamella Pert, MD [facility] Pager [phone]. If 7 PM - 7 AM, please contact night-coverage at [email or web address], password [facility] 08/16/2013, 8:24 AM  LOS: 1 day

## 2013-08-16 NOTE — Consult Note (Signed)
Palliative Medicine Team at [facility]  Date: 08/16/2013   Patient Name: Vanessa Bartlett  DOB: 05/06/1941  MRN: [id number]  Age / Sex: 72 y.o., female   PCP: Londell Moh, MD Referring Physician: Leatha Gilding, MD  HPI/Reason for Consultation: Ms. Tadros is a 72 yo woman with history of CVA, vascular dementia and mitral and aortic valve replacements who has three admissions in the last month with complications related to severe delirium, chronic progressive dysphagia and recurrent UTI. She was re-admitted on 12/2 after being discharged home with home health under the care of her son Lynnell Grain apparently decompensated from a mental and physical standpoint and developed fever associated with very difficult to control delirium behaviors including hallucinations, aggression and agitation. Her care was beyond what they could handle and they brought her back in for evaluation. PMT consulted for ongoing goals of care.  Participants in Discussion: Son, Casimiro Needle, primary decision maker and caregiver, no formal HCPOA, but he is clearly acting in his mother's best interest and actively involved with her care. He has a sister who is somewhat estranged, and they recently dealth death of his younger brother to leukemia.  Goals/Summary of Discussion:  1. Code Status:  DNR  2. Scope of Treatment:  Casimiro Needle now feels that what he witnessed in his mother at home in terms of her delirium was true suffering and wants to take a purely comfort approach to her care. She had done remarkably well for the past 29 years after her orignial stroke, but she has reached a point of decline that he now feels has no clear recovery. He wants her end of life care to be the best it can be.  Continue medications directly related to her comfort.  No feeding tubes, careful hand feeding  Aggressive control of her agitation and other symptoms  No rehospitalization  No IV antibiotics, oral ok if contributes to her  comfort  Continue oral anticoagulation since she has two artifical valves for now. If she decompensates or she is unable to swallow pills would be important to discontinue them at that time. Casimiro Needle is hopeful for a little bit more time with his mother when she is lucid, but understands that she may require sedation for comfort if her symptoms of delirium and agitation escalate.  3. Assessment/Plan:  Primary Diagnoses  1. Acute Delirium, in setting of advanced vascular dementia 2. Progressive terminal dysphagia, weight loss, minimal to no PO intake charted <50% of meals 3. Recurrent UTI 4. CVA, recent and remote 5. Artifical Mitral and Aortic Valves 6. Spinal Stenosis with Chronic Pain  Prognosis: weeks depending on PO intake   PPS: 30    Active Symptoms 1. Severe Agitation, requiring sedation for safety 2. Pain secondary to Spinal stenosis 3. Dysphagia, minimal PO intake   Started Lorazepam Intensol which should be continued on discharge  Roxanol q2 prn for pain-may be a source of her agitation-long history of spinal stenosis   4. Palliative Prophylaxis:   Bowel Regimen: Start senna-s prn  Terminal Secretions NA  Breakthrough Pain and Dyspnea- will start Roxanol, it seems opiates have been avoided in the past due to her dysphagia issues, but she has been on more sedating meds like gabapentin and amitriptyline for chronic pain, no history that she has ever been opiate dependent  Agitation and Delirium; Ativan prn has been effective in past  Nausea: zofran prn  5. Psychosocial Spiritual Asssessment/Interventions:  Patient and Family Adjustment to Illness/Prognosis: Son Casimiro Needle is appropriately grieving  and coping with his mother's decline and need for 24/7 care. He is committed to providing this care and is working with his Sport and exercise psychologist to work from home. He also has a domestic partner who has been helping, but his mothers behavior has been very stressful and they had no  provider support system to deal with her at home.  Spiritual Concerns or Needs: none expressed, he is still grieving the loss of his brother, he talked about his father's death as well.  6. Disposition: Casimiro Needle wants to take his mother home with Hospice, and with appropriate support and tools to manage her there including sedation if needed and professional help in keeping her comfortable. Home health was not able to advise or support them during her decompensation/crisis at home He tells me that she had such a horrible experience at SNF that he wants her to die peacefully at home. I discussed briefly Hospice Services, including respite for his caregiver burnout and inpatient should she need more care than could be provided in the home.   Time: 70 minutes Greater than 50%  of this time was spent counseling and coordinating care related to the above assessment and plan.  Signed by: Edsel Petrin, DO  08/16/2013, 9:28 PM  Please contact Palliative Medicine Team phone at [phone] for questions and concerns.

## 2013-08-16 NOTE — Progress Notes (Signed)
ANTICOAGULATION CONSULT NOTE - Initial Consult  Pharmacy Consult for Warfarin Indication: AVR/MVR  Allergies  Allergen Reactions  . Codeine     Patient Measurements: Height: 5\' 6"  (167.6 cm) Weight: 105 lb 14.4 oz (48.036 kg) IBW/kg (Calculated) : 59.3  Vital Signs: Temp: 97.5 F (36.4 C) (12/03 0603) Temp src: Oral (12/03 0603) BP: 111/44 mmHg (12/03 0603) Pulse Rate: 98 (12/03 0603)  Labs:  Recent Labs  08/15/13 0340 08/16/13 0408  HGB 11.3* 10.9*  HCT 33.5* 34.1*  PLT 233 224  LABPROT 37.1* 42.1*  INR 3.95* 4.66*  CREATININE 1.30* 1.33*    Estimated Creatinine Clearance: 29.8 ml/min (by C-G formula based on Cr of 1.33).   Assessment: 72 yoF on warfarin PTA for hx AVR/MVR and hx dementia admitted 12/2 with agitation. Pt had similar recent admission for episodes of agitation and hallucinations.  Home dose warfarin reported as 5mg  daily except 2.5mg  MWF. Last dose given on 12/1 with admission INR supratherapeutic at 3.95.  Pharmacy consulted to resume warfarin dosing.     INR remains supratherapeutic and increased to 4.66, despite holding warfarin dose yesterday  CBC: Hgb down to 10.9 and plt stable.  No bleeding or complications reported.    Diet: Dysphagia 2  Noted during last admission, pt received cipro for UTI and INR rose to 3.95.  This admission, Pt was started on Levaquin 12/2 for r/o aspiration pna and INR is increasing.  Renal function: Appears pt has CKD?, today SCr increased to 1.33    Goal of Therapy:  INR 2.5 to 3.5 Monitor platelets by anticoagulation protocol: Yes   Plan:  1.  Hold warfarin today 2.  F/u daily PT/INR  Lynann Beaver PharmD, BCPS Pager [phone] 08/16/2013 10:51 AM

## 2013-08-16 NOTE — Progress Notes (Signed)
Picked up patient at 2300. Received report from Chloe RN. Agree with previous assessment. Will continue to monitor closely. Sharyah Bostwick C, RN  

## 2013-08-16 NOTE — Progress Notes (Signed)
Pt had not voided since 2330 last night. She did void in the bedpan on that occurrence. Pt tried to use the bedpan again this a.m but was unsuccessful. Pt was bladder scanned and >953mL of urine was found to be in bladder. On-call MD was paged. MD ordered PRN catheterization. 1025 ml of urine was relieved from bladder. Will continue to encourage bedpan and monitor patient. Fraser Din, RN

## 2013-08-17 LAB — BASIC METABOLIC PANEL
BUN: 31 mg/dL — ABNORMAL HIGH (ref 6–23)
Calcium: 9.9 mg/dL (ref 8.4–10.5)
Chloride: 103 mEq/L (ref 96–112)
Creatinine, Ser: 1.68 mg/dL — ABNORMAL HIGH (ref 0.50–1.10)
GFR calc Af Amer: 34 mL/min — ABNORMAL LOW (ref >90)
GFR calc non Af Amer: 30 mL/min — ABNORMAL LOW (ref >90)
Glucose, Bld: 103 mg/dL — ABNORMAL HIGH (ref 70–99)

## 2013-08-17 LAB — CBC
HCT: 33.8 % — ABNORMAL LOW (ref 36.0–46.0)
MCH: 29.3 pg (ref 26.0–34.0)
MCHC: 32.5 g/dL (ref 30.0–36.0)
Platelets: 231 10*3/uL (ref 150–400)
RDW: 14.2 % (ref 11.5–15.5)

## 2013-08-17 LAB — PROTIME-INR
INR: 3.17 — ABNORMAL HIGH (ref 0.00–1.49)
Prothrombin Time: 31.4 seconds — ABNORMAL HIGH (ref 11.6–15.2)

## 2013-08-17 MED ORDER — LIDOCAINE 5 % EX PTCH: 1.0000 | MEDICATED_PATCH | CUTANEOUS | Status: AC

## 2013-08-17 MED ORDER — WARFARIN SODIUM 1 MG PO TABS
1.0000 mg | ORAL_TABLET | Freq: Once | ORAL | Status: DC
  Filled 2013-08-17: qty 1

## 2013-08-17 MED ORDER — WARFARIN SODIUM 2.5 MG PO TABS: 2.5000 mg | ORAL_TABLET | ORAL | Status: AC

## 2013-08-17 MED ORDER — WARFARIN SODIUM 5 MG PO TABS: 5.0000 mg | ORAL_TABLET | ORAL | Status: AC

## 2013-08-17 MED ORDER — LEVOFLOXACIN 750 MG PO TABS: 750.0000 mg | ORAL_TABLET | ORAL | Status: AC

## 2013-08-17 MED ORDER — MORPHINE SULFATE (CONCENTRATE) 10 MG /0.5 ML PO SOLN: 10.0000 mg | ORAL | Status: AC | PRN

## 2013-08-17 MED ORDER — POTASSIUM CHLORIDE CRYS ER 20 MEQ PO TBCR
40.0000 meq | EXTENDED_RELEASE_TABLET | Freq: Once | ORAL | Status: AC
  Administered 2013-08-17: 12:00:00 40 meq via ORAL
  Filled 2013-08-17: qty 2

## 2013-08-17 MED ORDER — WARFARIN SODIUM 1 MG PO TABS: 1.0000 mg | ORAL_TABLET | Freq: Once | ORAL | Status: AC

## 2013-08-17 MED ORDER — LORAZEPAM 2 MG/ML PO CONC: 1.0000 mg | ORAL | Status: AC | PRN

## 2013-08-17 MED ORDER — LORAZEPAM 0.5 MG PO TABS: 0.5000 mg | ORAL_TABLET | Freq: Four times a day (QID) | ORAL | Status: DC | PRN

## 2013-08-17 NOTE — Progress Notes (Signed)
ANTICOAGULATION CONSULT NOTE - Follow up Consult  Pharmacy Consult for Warfarin Indication: AVR/MVR  Allergies  Allergen Reactions  . Codeine     Patient Measurements: Height: 5\' 6"  (167.6 cm) Weight: 106 lb 4.2 oz (48.2 kg) IBW/kg (Calculated) : 59.3  Vital Signs: Temp: 97.6 F (36.4 C) (12/04 0330) Temp src: Oral (12/04 0330) BP: 120/66 mmHg (12/04 0330) Pulse Rate: 87 (12/04 0330)  Labs:  Recent Labs  08/15/13 0340 08/16/13 0408 08/17/13 0355  HGB 11.3* 10.9* 11.0*  HCT 33.5* 34.1* 33.8*  PLT 233 224 231  LABPROT 37.1* 42.1* 31.4*  INR 3.95* 4.66* 3.17*  CREATININE 1.30* 1.33* 1.68*    Estimated Creatinine Clearance: 23.7 ml/min (by C-G formula based on Cr of 1.68).   Assessment: 72 yoF on warfarin PTA for hx AVR/MVR and hx dementia admitted 12/2 with agitation. Pt had similar recent admission for episodes of agitation and hallucinations.  Home dose warfarin reported as 5mg  daily except 2.5mg  MWF. Last dose given on 12/1 with admission INR supratherapeutic at 3.95.  Pharmacy consulted to resume warfarin dosing.     INR decreased to therapeutic range at 3.17 after holding for supratherapeutic levels.    CBC: Hgb 11 and plt stable.  No bleeding or complications reported.    Diet: Dysphagia 2  Noted drug-drug interaction with Levaquin (starated 12/2) which is likely causing elevated INR.    Renal function: Appears pt has CKD?, today SCr increased to 1.68  Noted palliative care plan to continue oral anticoagulation during comfort care approach.  Will resume low-dose warfarin tonight since INR is within goal range.   Goal of Therapy:  INR 2.5 to 3.5 Monitor platelets by anticoagulation protocol: Yes   Plan:   Warfarin 1mg  PO today at 1800  F/u daily PT/INR  Lynann Beaver PharmD, BCPS Pager [phone] 08/17/2013 7:52 AM

## 2013-08-17 NOTE — Progress Notes (Signed)
CSW consulted for transportation needs. Patient will need non-emergency ambulance transport home. CSW confirmed home address with patient's son, Casimiro Needle. [facility] called for 2:00p pickup (Service Request Id: [id number]). D/C Summary faxed to [facility]. No other CSW needs identified - CSW signing off.   Unice Bailey, LCSW [facility] Clinical Social Worker cell #: [phone]

## 2013-08-17 NOTE — Discharge Summary (Signed)
Physician Discharge Summary  Vanessa Bartlett YQM:[id number] DOB: December 20, 1940 DOA: 08/15/2013  PCP: Londell Moh, MD  Admit date: 08/15/2013 Discharge date: 08/17/2013  Time spent: 35 minutes  Recommendations for Outpatient Follow-up:  1. Follow up with hospice services as scheduled  2. Follow up with PCP in 1 week  Recommendations:  - please take lower dose of Coumadin of 1 mg daily while on Levofloxacin then resume previous dose.  - for PCP, please repeat BMP - please have INR rechecked 12/4  Discharge Diagnoses:  Principal Problem:   Dementia with behavioral disturbance Active Problems:   TOBACCO ABUSE   DEPRESSION   HYPERTENSION   MITRAL VALVE REPLACEMENT, HX OF   AORTIC VALVE REPLACEMENT, HX OF   Orthostatic hypotension   Restless legs syndrome (RLS)   Dysphagia, unspecified(787.20)   Protein-calorie malnutrition, severe   Delirium   Agitation   Volume overload   Aspiration pneumonia  Discharge Condition: with hospice  Diet recommendation: as tolerated  Filed Weights   08/15/13 0757 08/16/13 0900 08/17/13 0330  Weight: 51.256 kg (113 lb) 48.036 kg (105 lb 14.4 oz) 48.2 kg (106 lb 4.2 oz)   History of present illness:  Vanessa Bartlett is a 72 y.o. female Just discharged from the hospital service last week for episodes of agitation and hallucinations felt to be in part from dementia but also exacerbated by possible medication including antibiotics. Patient was improved and she was discharged home to care of her son after consultation out of care.  Patient was home for only a few days and started having decreased by mouth increased agitation and violent outbursts. Patient's son who is a very thorough caregiver apparently, reported a fever last night. Patient brought into the emergency room. Lab work was unremarkable noting actually improvement in hemoglobin, renal function and electrolytes. White count was normal and patient's INR is the only abnormal lab which was  slightly supratherapeutic. Repeat urinalysis was normal. Chest x-ray not done. Hospitalists were called for further evaluation and admission.  Hospital Course:  Dementia with behavioral disturbance: Possibly due to an underlying pneumonia. Patient reportedly had fevers, although nothing documented here. Normal white count. Given review of patient's CT which is severe atrophy of the brain, likely this is some component of end-stage dementia.  HCAP vs aspitation PNA - patient was started and responded well to Levaquin. She is breathing comfortably on room air and will complete a course as an outpatient.  BNP elevation - s/p Lasix x 2 doses. Recent 2D echo obtained 08/12/2013 showed normal EF 55-60% with mild LVH.  TOBACCO ABUSE  DEPRESSION: Continue Zoloft  HYPERTENSION: Stable, continue antihypertensives  MITRAL VALVE REPLACEMENT, HX OF  AORTIC VALVE REPLACEMENT, HX OF continue Coumadin at lower dose while on Levofloxacin, then resume the doses used previously. Discussed with pharmacy.   Orthostatic hypotension: Continue [place]  Restless legs syndrome (RLS) Dysphagia, unspecified(787.20): Dysphagia 2 diet as recommended by speech therapy last admission  Protein-calorie malnutrition, severe: Patient again taking poor by mouth.  Agitation: See above. Meantime control with when necessary Ativan   Goals of care: Palliative medicine has been consulted and met with patient's son, Vanessa Bartlett. Patient will be discharged home with hospice. Palliative recommendations are as follows:  Scope of Treatment:  Vanessa Bartlett now feels that what he witnessed in his mother at home in terms of her delirium was true suffering and wants to take a purely comfort approach to her care. She had done remarkably well for the past 29 years after her orignial  stroke, but she has reached a point of decline that he now feels has no clear recovery. He wants her end of life care to be the best it can be.  Continue medications directly  related to her comfort.  No feeding tubes, careful hand feeding  Aggressive control of her agitation and other symptoms  No rehospitalization  No IV antibiotics, oral ok if contributes to her comfort  Continue oral anticoagulation since she has two artifical valves for now. If she decompensates or she is unable to swallow pills would be important to discontinue them at that time. Vanessa Bartlett is hopeful for a little bit more time with his mother when she is lucid, but understands that she may require sedation for comfort if her symptoms of delirium and agitation escalate.   Procedures:  none   Consultations:  palliative  Discharge Exam: Filed Vitals:   08/16/13 0900 08/16/13 1413 08/16/13 2015 08/17/13 0330  BP:  117/53 150/68 120/66  Pulse:  98 91 87  Temp:  97.9 F (36.6 C) 98.4 F (36.9 C) 97.6 F (36.4 C)  TempSrc:  Oral Oral Oral  Resp:  18 18 18   Height:      Weight: 48.036 kg (105 lb 14.4 oz)   48.2 kg (106 lb 4.2 oz)  SpO2:  98% 94% 99%    General: NAD, pleasantly demented Cardiovascular: RRR Respiratory: CTA biL  Discharge Instructions       Future Appointments Provider Department Dept Phone   09/19/2013 4:30 PM Lewayne Bunting, MD [facility] [place] Office [phone]       Medication List    STOP taking these medications       amoxicillin 500 MG capsule  Commonly known as:  AMOXIL     LORazepam 0.5 MG tablet  Commonly known as:  ATIVAN  Replaced by:  LORazepam 2 MG/ML concentrated solution      TAKE these medications       atorvastatin 10 MG tablet  Commonly known as:  LIPITOR  Take 10 mg by mouth daily.     CALCIUM 500 + D PO  Take 500 mg by mouth 2 (two) times daily.     feeding supplement (ENSURE COMPLETE) Liqd  Take 237 mLs by mouth 2 (two) times daily between meals.     levofloxacin 750 MG tablet  Commonly known as:  LEVAQUIN  Take 1 tablet (750 mg total) by mouth every other day.     lidocaine 5 %  Commonly known as:   LIDODERM  Place 1 patch onto the skin daily. Remove & Discard patch within 12 hours or as directed by MD     LORazepam 2 MG/ML concentrated solution  Commonly known as:  ATIVAN  Take 0.5 mLs (1 mg total) by mouth every 4 (four) hours as needed for anxiety, seizure, sedation or sleep.     midodrine 5 MG tablet  Commonly known as:  PROAMATINE  Take 1 tablet (5 mg total) by mouth 3 (three) times daily with meals.     morphine CONCENTRATE 10 mg / 0.5 ml concentrated solution  Take 0.5 mLs (10 mg total) by mouth every 2 (two) hours as needed for moderate pain, severe pain, anxiety or shortness of breath.     nitroGLYCERIN 0.4 MG SL tablet  Commonly known as:  NITROSTAT  Place 0.4 mg under the tongue every 5 (five) minutes as needed for chest pain.     pramipexole 0.25 MG tablet  Commonly known as:  MIRAPEX  Take 0.25 mg by mouth at bedtime.     sertraline 100 MG tablet  Commonly known as:  ZOLOFT  Take 100 mg by mouth daily.     warfarin 1 MG tablet  Commonly known as:  COUMADIN  Take 1 tablet (1 mg total) by mouth one time only at 6 PM. Take this dose while on Levofloxacin, then resume previous dose.     warfarin 2.5 MG tablet  Commonly known as:  COUMADIN  Take 1 tablet (2.5 mg total) by mouth every Monday, Wednesday, and Friday. Hold this dose until you finish Levofloxacin     warfarin 5 MG tablet  Commonly known as:  COUMADIN  - Take 1 tablet (5 mg total) by mouth every Tuesday, Thursday, Saturday, and Sunday at 6 PM. Pt takes on Tues Thurs, Sat, Sun  - Hold this dose until you finish Levofloxacin         The results of significant diagnostics from this hospitalization (including imaging, microbiology, ancillary and laboratory) are listed below for reference.    Significant Diagnostic Studies: Dg Chest 1 View  08/09/2013   CLINICAL DATA:  Two-day history of altered mental status.  EXAM: CHEST - 1 VIEW  COMPARISON:  None.  FINDINGS: The lungs are well-expanded. The  cardiopericardial silhouette is normal in size. The patient has undergone previous median sternotomy and reportedly aortic and mitral valve replacement. The pulmonary vascularity is not engorged. The interstitial markings are minimally prominent diffusely. There is no pleural effusion or pneumothorax. The observed portions of the bony thorax exhibit no acute abnormality. There are deformities of the 1st ribs bilaterally.  IMPRESSION: There is no evidence of pneumonia nor definite evidence of CHF. There are mildly increased interstitial markings in both lungs but these are of uncertain etiology and duration. This signed report   Electronically Signed   By: David  [place]   On: 08/09/2013 18:59   Ct Head Wo Contrast  08/11/2013   CLINICAL DATA:  Difficulty speaking.  EXAM: CT HEAD WITHOUT CONTRAST  TECHNIQUE: Contiguous axial images were obtained from the base of the skull through the vertex without intravenous contrast.  COMPARISON:  CT 08/09/2013.  FINDINGS: Stable severe bilateral temporal parietal, left side greater than right, cerebral atrophy is present. No mass. No hemorrhage. No hydrocephalus. No acute bony abnormality identified. Visualized paranasal sinuses are clear. Orbits are unremarkable. Mastoids are unremarkable.  IMPRESSION: Stable cerebral atrophy.  No acute abnormality.   Electronically Signed   By: Maisie Fus  Register   On: 08/11/2013 17:01   Ct Head Wo Contrast  08/09/2013   CLINICAL DATA:  Altered mental status.  EXAM: CT HEAD WITHOUT CONTRAST  TECHNIQUE: Contiguous axial images were obtained from the base of the skull through the vertex without intravenous contrast.  COMPARISON:  08/04/2013  FINDINGS: No skull fracture is noted. Paranasal sinuses and mastoid air cells are unremarkable. Stable encephalomalacia left parietal lobe. Stable encephalomalacia and right temporal lobe. Again noted cerebral atrophy. No acute cortical infarction. No mass lesion is noted on this unenhanced scan.   IMPRESSION: No acute intracranial abnormality.  No significant change.   Electronically Signed   By: Natasha Mead M.D.   On: 08/09/2013 18:53   Ct Head Wo Contrast  08/04/2013   CLINICAL DATA:  Patient on Coumadin with multiple recent falls  EXAM: CT HEAD WITHOUT CONTRAST  TECHNIQUE: Contiguous axial images were obtained from the base of the skull through the vertex without intravenous contrast.  COMPARISON:  06/08/2013, 02/15/2012  FINDINGS: No hemorrhage or extra-axial fluid. No skull fracture. Encephalomalacia left parietal lobe stable. Encephalomalacia right temporal lobe stable. Mild diffuse atrophy unchanged. No evidence of mass, acute infarct, or hydrocephalus.  IMPRESSION: No acute findings.   Electronically Signed   By: Esperanza Heir M.D.   On: 08/04/2013 17:13   Portable Chest 1 View  08/15/2013   CLINICAL DATA:  Altered mental status.  EXAM: PORTABLE CHEST - 1 VIEW  COMPARISON:  Chest radiograph 08/09/2013  FINDINGS: Stable cardiac and mediastinal contours given differences in patient positioning. Calcification of the thoracic aorta. No significant interval change bilateral interstitial opacities. Consolidative heterogeneous opacity within the retrocardiac region. No pleural effusion or pneumothorax. Regional skeleton is unremarkable.  IMPRESSION: 1. Heterogeneous retrocardiac opacities which may represent infection in the appropriate clinical setting. Continued radiographic followup to ensure resolution is recommended. 2. Unchanged bilateral interstitial opacities which are of uncertain chronicity and may represent an acute or chronic process.   Electronically Signed   By: Annia Belt M.D.   On: 08/15/2013 16:12    Microbiology: Recent Results (from the past 240 hour(s))  CULTURE, BLOOD (SINGLE)     Status: None   Collection Time    08/09/13  7:55 PM      Result Value Range Status   Specimen Description BLOOD RIGHT ANTECUBITAL   Final   Special Requests BOTTLES DRAWN AEROBIC ONLY 10CC    Final   Culture  Setup Time     Final   Value: 08/10/2013 02:55     Performed at [facility]   Culture     Final   Value: NO GROWTH 5 DAYS     Performed at [facility]   Report Status 08/16/2013 FINAL   Final  MRSA PCR SCREENING     Status: None   Collection Time    08/10/13  2:49 AM      Result Value Range Status   MRSA by PCR NEGATIVE  NEGATIVE Final   Comment:            The GeneXpert MRSA Assay (FDA     approved for NASAL specimens     only), is one component of a     comprehensive MRSA colonization     surveillance program. It is not     intended to diagnose MRSA     infection nor to guide or     monitor treatment for     MRSA infections.     Labs: Basic Metabolic Panel:  Recent Labs Lab 08/11/13 0505 08/12/13 0610 08/15/13 0340 08/16/13 0408 08/17/13 0355  NA 148* 142 135 138 141  K 4.1 3.9 3.6 3.4* 3.4*  CL 113* 108 99 101 103  CO2 28 27 22 24 27   GLUCOSE 95 90 147* 82 103*  BUN 20 15 16 15  31*  CREATININE 1.47* 1.26* 1.30* 1.33* 1.68*  CALCIUM 9.1 8.7 9.9 9.7 9.9  MG  --  2.0  --   --   --    Liver Function Tests:  Recent Labs Lab 08/11/13 0505 08/15/13 0340  AST 101* 52*  ALT 21 20  ALKPHOS 117 141*  BILITOT 0.2* 0.5  PROT 6.1 7.6  ALBUMIN 2.7* 3.5   CBC:  Recent Labs Lab 08/11/13 0505 08/15/13 0340 08/16/13 0408 08/17/13 0355  WBC 4.3 8.2 6.1 6.3  NEUTROABS  --  7.0  --   --   HGB 9.4* 11.3* 10.9* 11.0*  HCT 29.6* 33.5* 34.1* 33.8*  MCV 94.6  88.4 90.5 90.1  PLT 173 233 224 231   Cardiac Enzymes:  Recent Labs Lab 08/12/13 0610  CKTOTAL 132   BNP: BNP (last 3 results)  Recent Labs  08/15/13 0340  PROBNP 1786.0*   CBG:  Recent Labs Lab 08/12/13 1657 08/12/13 2006 08/13/13 0008 08/13/13 0448 08/13/13 0802  GLUCAP 98 138* 112* 95 117*   Signed:  Ivory Maduro  [facility] 08/17/2013, 12:41 PM

## 2013-08-17 NOTE — Progress Notes (Signed)
AVS packet sent with pt's son Casimiro Needle at discharge. All questions were answered, prescriptions were given, and foley care was reviewed. Pt to be sent home via [facility].

## 2013-08-17 NOTE — Progress Notes (Signed)
Spoke with pt's son Casimiro Needle [phone], concerning Hospice providers. [facility] was selected, referral was called [phone], information faxed [phone].

## 2013-08-25 ENCOUNTER — Ambulatory Visit: Payer: Medicare Other | Admitting: Nurse Practitioner

## 2013-09-01 DIAGNOSIS — Z7901 Long term (current) use of anticoagulants: Secondary | ICD-10-CM | POA: Insufficient documentation

## 2013-09-01 DIAGNOSIS — F419 Anxiety disorder, unspecified: Secondary | ICD-10-CM | POA: Insufficient documentation

## 2013-09-01 NOTE — Progress Notes (Signed)
Patient ID: Vanessa Bartlett, female   DOB: 09/26/1940, 72 y.o.   MRN: [id number]                      PROGRESS NOTE  DATE: 08/08/2013  FACILITY: Nursing Home Location: [facility]  LEVEL OF CARE: SNF (31)  Acute Visit  CHIEF COMPLAINT:  Follow-up hospitalization  HISTORY OF PRESENT ILLNESS: This is a 72 year old female who has been admitted to [facility] on 08/07/13 from [facility]. She has been having issues with orthostatic hypotension and frequent falls. She is on chronic Coumadin therapy due to mechanical aortic and mitral valve replacement. She has been admitted for a short-term rehabilitation.  REASSESSMENT OF ONGOING PROBLEM(S):  ORTHOSTATIC HYPOTENSION: The orthostatcic hypotension remains stable.  Pt is tolerating current medications without any adverse reactions.  No dizziness or lightheadedness reported.  Last BP 110/60  DEPRESSION: The depression remains stable. Patient denies ongoing feelings of sadness, insomnia, anedhonia or lack of appetite. No complications reported from the medications currently being used. Staff do not report behavioral problems.  HYPERLIPIDEMIA: No complications from the medications presently being used.    PAST MEDICAL HISTORY : Reviewed.  No changes.  CURRENT MEDICATIONS: Reviewed per [facility]  REVIEW OF SYSTEMS:  GENERAL: no change in appetite, no fatigue, no weight changes, no fever, chills or weakness RESPIRATORY: no cough, SOB, DOE, wheezing, hemoptysis CARDIAC: no chest pain, edema or palpitations GI: no abdominal pain, diarrhea, constipation, heart burn, nausea or vomiting  PHYSICAL EXAMINATION  VS:  T97.8       P80      RR20      BP110/60          WT116.4 (Lb)  GENERAL: no acute distress, normal body habitus EYES: conjunctivae normal, sclerae normal, normal eye lids NECK: supple, trachea midline, no neck masses, no thyroid tenderness, no thyromegaly LYMPHATICS: no LAN in the neck, no supraclavicular  LAN RESPIRATORY: breathing is even & unlabored, BS CTAB CARDIAC: RRR, no murmur,no extra heart sounds, no edema GI: abdomen soft, normal BS, no masses, no tenderness, no hepatomegaly, no splenomegaly EXTREMITIES:  Right-sided weakness PSYCHIATRIC: the patient is alert & oriented to person, anxious  LABS/RADIOLOGY: 08/05/13 sodium 142 potassium 4.3 glucose 79 BUN 15 creatinine 1.58 calcium 8.9 WBC 4.3 hemoglobin 10.1 hematocrit 31.0   ASSESSMENT/PLAN:  Hypotension - continue Midodrine  Depression - continue Zoloft and Elavil   Hyperlipidemia - continue Lipitor  Restless leg syndrome - continue Mirapex and Neurontin  History of CVA -  Stable  Long term use of anticoagulant - INR=2.1 - therapeutic; Continue Coumadin 5 mg PO Q T, TH, S, S and Coumadin 2.5 mg 1 tab PO Q M-W-F; check INR on 08/11/13  Anxiety - start Ativan 0.5 mg 1 tab PO QID PRN   CPT CODE: [id number]

## 2013-09-02 ENCOUNTER — Other Ambulatory Visit: Payer: Self-pay | Admitting: Neurology

## 2013-09-14 DEATH — deceased

## 2013-09-19 ENCOUNTER — Ambulatory Visit: Payer: Medicare Other | Admitting: Cardiology

## 2013-09-25 ENCOUNTER — Encounter: Payer: Self-pay | Admitting: Internal Medicine

## 2013-09-25 DIAGNOSIS — G609 Hereditary and idiopathic neuropathy, unspecified: Secondary | ICD-10-CM | POA: Insufficient documentation

## 2013-09-25 NOTE — Progress Notes (Signed)
Patient ID: Vanessa Bartlett, female   DOB: 04/12/1941, 73 y.o.   MRN: [id number]               HISTORY & PHYSICAL  DATE: 08/09/2013     FACILITY: [facility]  LEVEL OF CARE: SNF (31)  ALLERGIES:  Allergies  Allergen Reactions  . Codeine     CHIEF COMPLAINT:  Manage hypertension, CVA, and peripheral neuropathy.    HISTORY OF PRESENT ILLNESS:  The patient is a 73 year-old, Caucasian female who was hospitalized secondary to a fall and dizziness.  After hospitalization, she is admitted to this facility for short-term rehabilitation.  Patient is a poor historian.  She has the following problems:    HTN: Pt 's HTN remains stable.  Staff denies CP, sob, DOE, pedal edema, headaches, dizziness or visual disturbances.  No complications from the medications currently being used.  Last BP :   150/97.  CVA: The patient's CVA remains stable.  Staff denies new neurologic symptoms such as numbness, tingling, weakness, speech difficulties or visual disturbances.  No complications reported from the medications currently being used.  The patient  apparently has mild right-sided weakness and dysarthria.    PERIPHERAL NEUROPATHY: The peripheral neuropathy is stable. The staff denies pain in the feet, tingling, and numbness. No complications noted from the medication presently being used.    PAST MEDICAL HISTORY :  Past Medical History  Diagnosis Date  . RHEUMATIC HEART DISEASE   . HYPERTENSION   . MITRAL VALVE REPLACEMENT, HX OF   . DEPRESSION   . AORTIC VALVE REPLACEMENT, HX OF   . Headache   . Hx of vertigo   . Chronic low back pain   . GERD (gastroesophageal reflux disease)   . Osteoporosis   . Dyslipidemia   . Cerebrovascular disease     with left brain stroke, residual aphasia  . Orthostatic hypotension 02/22/2013  . Restless legs syndrome (RLS) 02/22/2013    PAST SURGICAL HISTORY: Past Surgical History  Procedure Laterality Date  . Aortic valve replacement    . Mitral  valve replacement 1990    . Lumbar disc surgery    . Arthroscopic  knee  surgery    . Cholecystectomy    . Dilation and curettage of uterus    . Tubal ligation    . Right knee surgery in 1993.    . Abdominal hysterectomy    . Cataract extraction Bilateral   . Tonsillectomy    . Appendectomy      SOCIAL HISTORY:  reports that she has been smoking.  She has never used smokeless tobacco. She reports that she does not drink alcohol or use illicit drugs.  FAMILY HISTORY:  Family History  Problem Relation Age of Onset  . Cancer    . Heart attack    . Diabetes    . Cancer Mother     esophageal cancer  . Cancer Father     Throat cancer  . Diabetes Sister     Kidney Cancer  . Heart disease Brother     Lung Cancer  . Aneurysm Brother     Abdominal aortic aneurysm  . Cancer Brother     lung  . Cancer Sister     Breast cancer    CURRENT MEDICATIONS: [facility]  REVIEW OF SYSTEMS:  Unobtainable.  Patient is a poor historian.      PHYSICAL EXAMINATION  VS:  T 96.8  P 70      RR 20      BP 150/97      POX 99%        WT (Lb)  GENERAL: no acute distress, normal body habitus EYES: conjunctivae normal, sclerae normal, normal eye lids MOUTH/THROAT: lips without lesions,no lesions in the mouth,tongue is without lesions,uvula elevates in midline NECK: supple, trachea midline, no neck masses, no thyroid tenderness, no thyromegaly LYMPHATICS: no LAN in the neck, no supraclavicular LAN RESPIRATORY: breathing is even & unlabored, BS CTAB CARDIAC: RRR, no murmur,no extra heart sounds, no edema GI:  ABDOMEN: abdomen soft, normal BS, no masses, no tenderness  LIVER/SPLEEN: no hepatomegaly, no splenomegaly MUSCULOSKELETAL: HEAD: normal to inspection & palpation BACK: no kyphosis, scoliosis or spinal processes tenderness EXTREMITIES: LEFT UPPER EXTREMITY: grossly moves extremity, unable to assess, patient not following commands   RIGHT UPPER EXTREMITY: grossly moves  extremity, unable to assess, patient not following commands   LEFT LOWER EXTREMITY: grossly moves extremity, unable to assess, patient not following commands   RIGHT LOWER EXTREMITY: grossly moves extremity, unable to assess, patient not following commands    PSYCHIATRIC: the patient is alert, disoriented, and somewhat combative    LABS/RADIOLOGY: CT of the head:  No acute findings.    Hemoglobin 10.1, otherwise CBC normal.    Creatinine 1.58, albumin 2.9, alkaline phosphatase 132, otherwise CMP normal.    Labs reviewed: Basic Metabolic Panel:  Recent Labs  11/91/47 0505 08/12/13 0610 08/15/13 0340 08/16/13 0408 08/17/13 0355  NA 148* 142 135 138 141  K 4.1 3.9 3.6 3.4* 3.4*  CL 113* 108 99 101 103  CO2 28 27 22 24 27   GLUCOSE 95 90 147* 82 103*  BUN 20 15 16 15  31*  CREATININE 1.47* 1.26* 1.30* 1.33* 1.68*  CALCIUM 9.1 8.7 9.9 9.7 9.9  MG  --  2.0  --   --   --    Liver Function Tests:  Recent Labs  08/10/13 1035 08/11/13 0505 08/15/13 0340  AST 133* 101* 52*  ALT 22 21 20   ALKPHOS 113 117 141*  BILITOT 0.4 0.2* 0.5  PROT 6.2 6.1 7.6  ALBUMIN 2.9* 2.7* 3.5    Recent Labs  08/09/13 1958  AMMONIA 18   CBC:  Recent Labs  08/15/13 0340 08/16/13 0408 08/17/13 0355  WBC 8.2 6.1 6.3  NEUTROABS 7.0  --   --   HGB 11.3* 10.9* 11.0*  HCT 33.5* 34.1* 33.8*  MCV 88.4 90.5 90.1  PLT 233 224 231   Lipid Panel:  Recent Labs  08/10/13 0443  HDL 69   Cardiac Enzymes:  Recent Labs  08/10/13 1035 08/12/13 0610  CKTOTAL 1447* 132   CBG:  Recent Labs  08/13/13 0008 08/13/13 0448 08/13/13 0802  GLUCAP 112* 95 117*    ASSESSMENT/PLAN:  Hypertension.  Blood pressure borderline.  We will monitor.    CVA.  Not on aspirin currently.      Peripheral neuropathy.  Continue Neurontin.    Restless legs syndrome.  Continue Requip.    Hyperlipidemia.  Continue Lipitor.    Orthostatic hypotension.  Currently on midodrine.    Check CBC and BMP.      THN Metrics:   Current smoker.  Not on aspirin.  Blood pressure:  150/97.    I have reviewed patient's medical records received at admission/from hospitalization.  CPT CODE: [id number]

## 2013-10-02 ENCOUNTER — Telehealth: Payer: Self-pay

## 2013-10-02 NOTE — Telephone Encounter (Signed)
Patient past away per [facility] [employer]

## 2015-09-25 IMAGING — CT CT HEAD W/O CM
1 of 3 series · 13 of 30 positions shown, 17 images · non-contrast
Comparison: 08/04/2013

CLINICAL DATA: Altered mental status.

EXAM:
CT HEAD WITHOUT CONTRAST
TECHNIQUE: Contiguous axial images were obtained from the base of the skull
through the vertex without intravenous contrast.

[Series 2: head 5.0 h30s · axial · 0.46mm/px · z∈[-148,-18]mm · 13 of 32 slices shown, 17 images]
[im 3/32  brain]
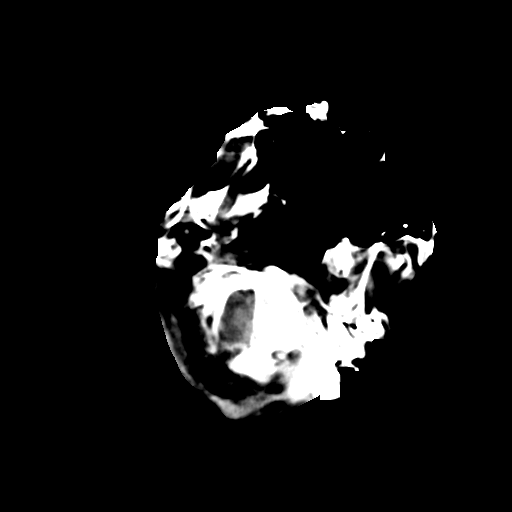
[im 3/32  bone]
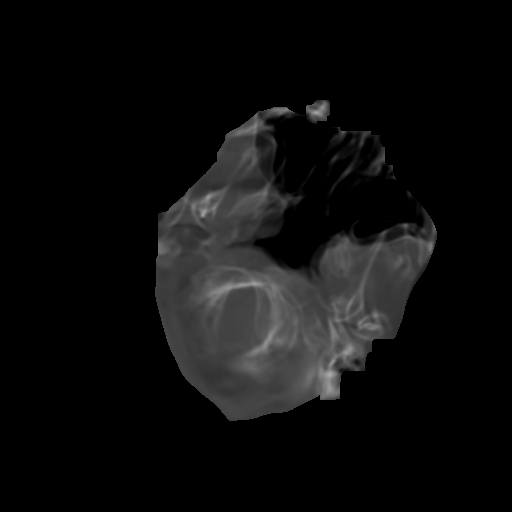
[im 5/32  brain]
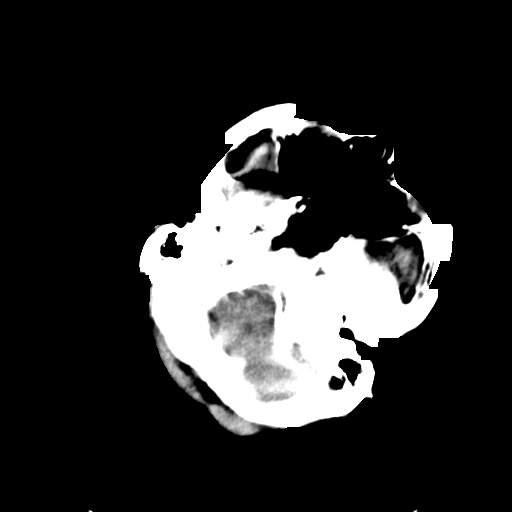
[im 7/32  brain]
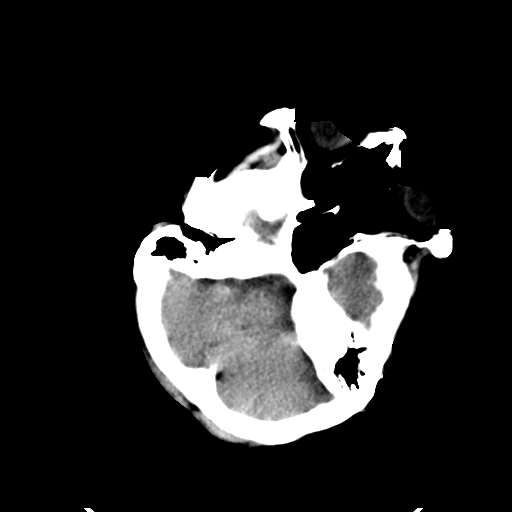
[im 9/32  brain]
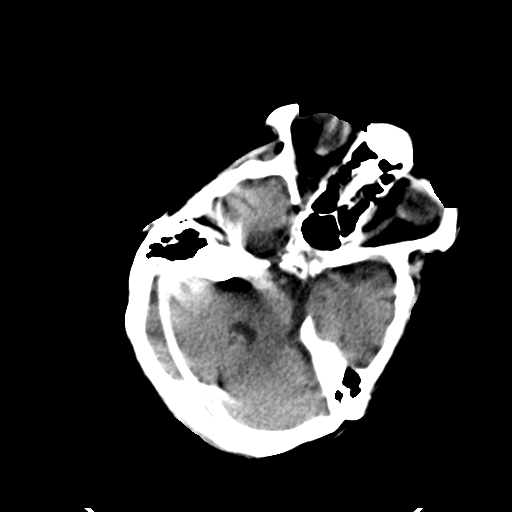
[im 12/32  brain]
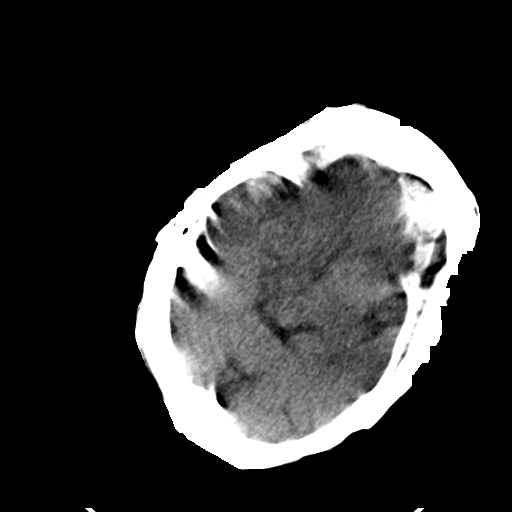
[im 12/32  bone]
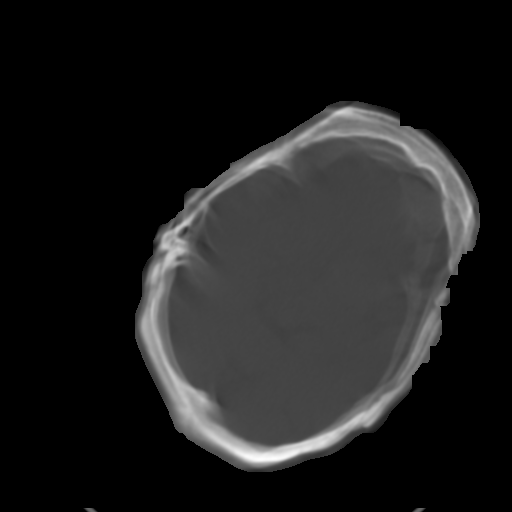
[im 14/32  brain]
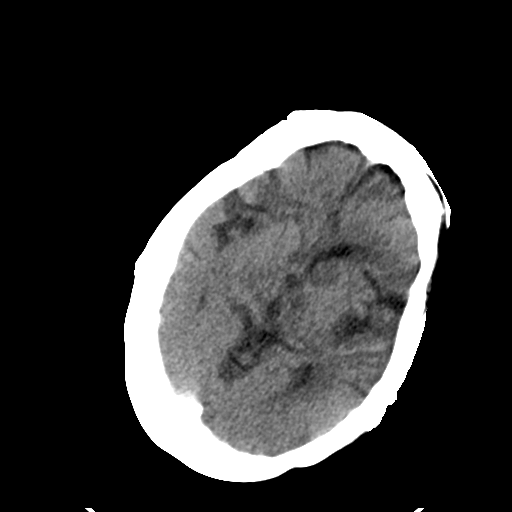
[im 16/32  brain]
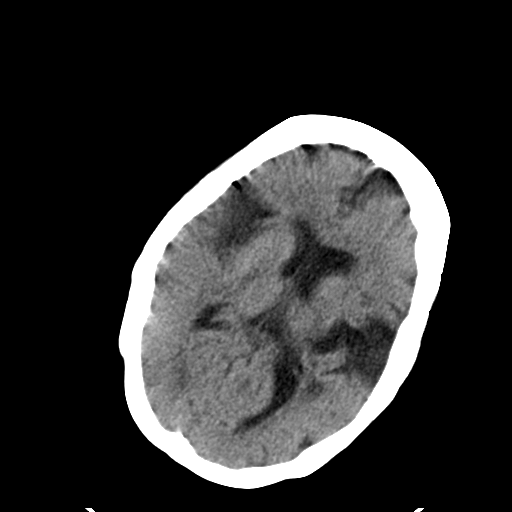
[im 18/32  brain]
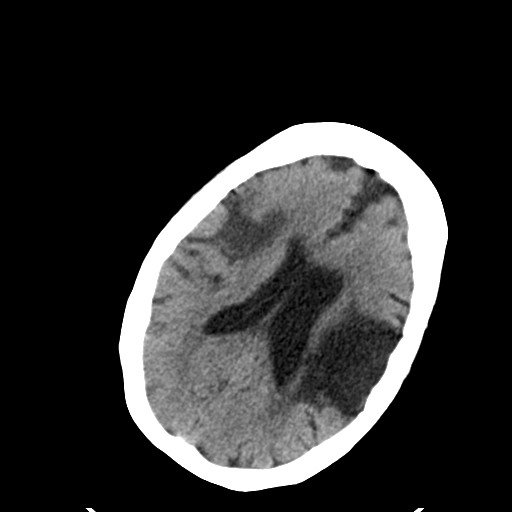
[im 20/32  brain]
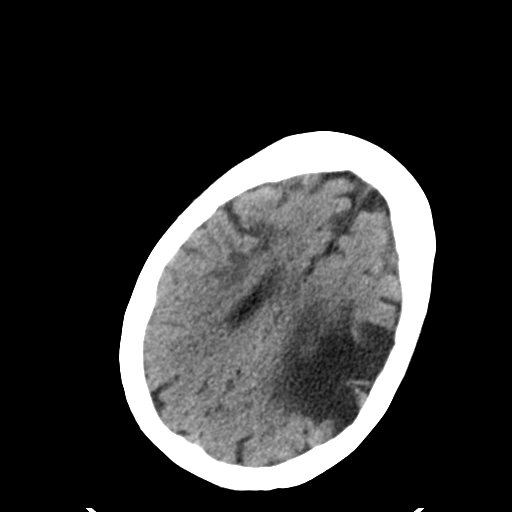
[im 20/32  bone]
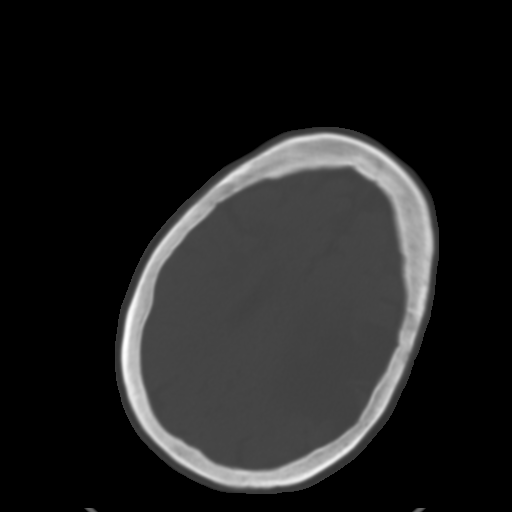
[im 23/32  brain]
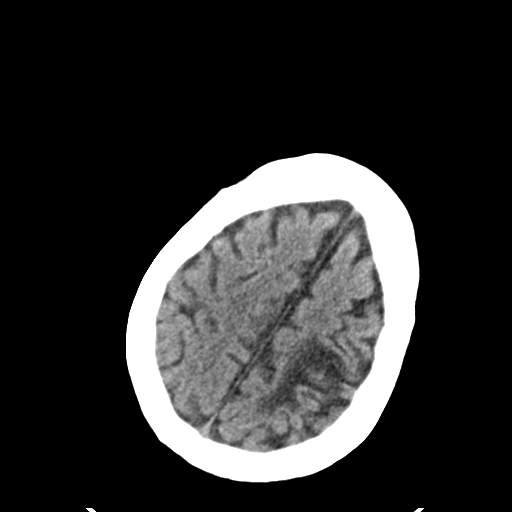
[im 25/32  brain]
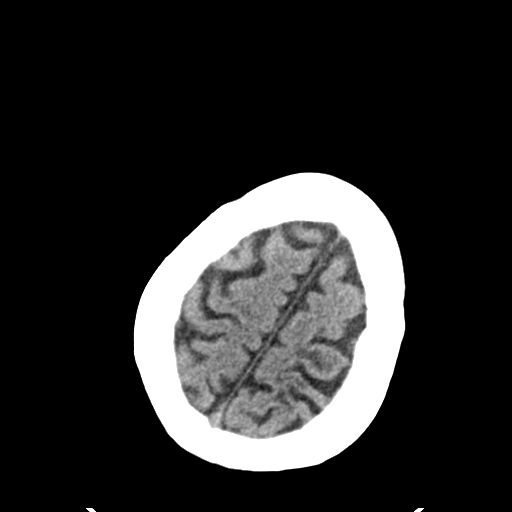
[im 27/32  brain]
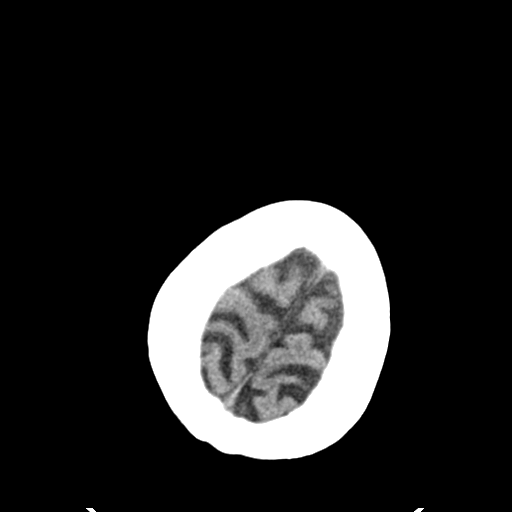
[im 29/32  brain]
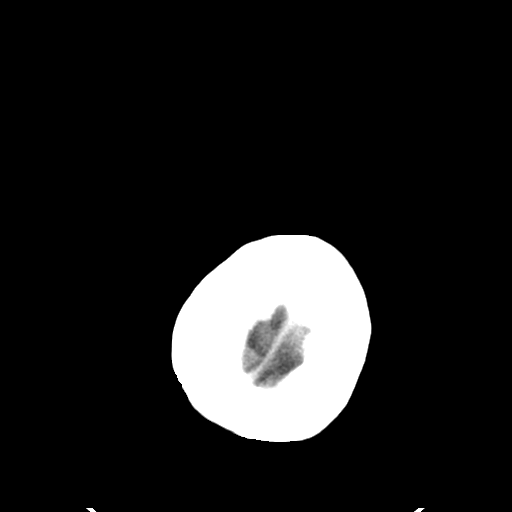
[im 29/32  bone]
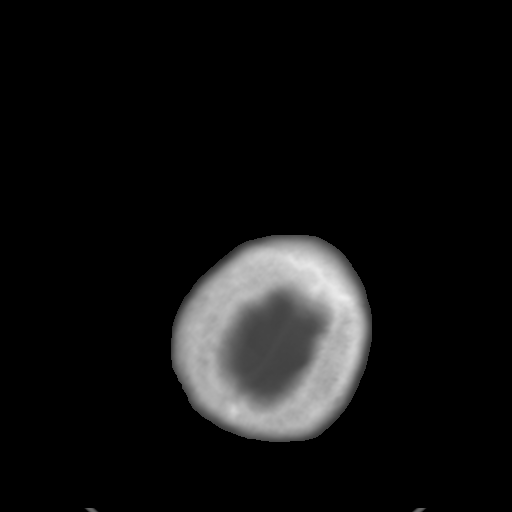

[13 of 30 positions shown; findings below may reference images not displayed]

FINDINGS: No skull fracture is noted. Paranasal sinuses and mastoid air cells
are unremarkable. Stable encephalomalacia left parietal lobe. Stable
encephalomalacia and right temporal lobe. Again noted cerebral
atrophy. No acute cortical infarction. No mass lesion is noted on
this unenhanced scan.
IMPRESSION: No acute intracranial abnormality.  No significant change.
